# Patient Record
Sex: Female | Born: 1980 | Race: White | Hispanic: No | Marital: Single | Smoking: Former smoker
Health system: Southern US, Community
[De-identification: ages and names within clinical notes are randomized; demographics above are authoritative.]

## PROBLEM LIST (undated history)

## (undated) DIAGNOSIS — A749 Chlamydial infection, unspecified: Secondary | ICD-10-CM

## (undated) DIAGNOSIS — Z789 Other specified health status: Secondary | ICD-10-CM

## (undated) DIAGNOSIS — B009 Herpesviral infection, unspecified: Secondary | ICD-10-CM

## (undated) HISTORY — PX: WISDOM TOOTH EXTRACTION: SHX21

---

## 2008-01-08 HISTORY — PX: OTHER SURGICAL HISTORY: SHX169

## 2013-01-07 NOTE — L&D Delivery Note (Signed)
Delivery Note  SVD viable female Apgars 9,9 over small vaginal laceration.  Placenta delivered spontaneously intact with 3VC. Repair with 2-0 Chromic with good support and hemostasis noted and R/V exam confirms.  PH art was sent.  [place] cord blood was not done.  Mother and baby were doing well.  EBL 300cc  Candice Campavid Wayburn Shaler, MD

## 2013-05-14 LAB — OB RESULTS CONSOLE GC/CHLAMYDIA
CHLAMYDIA, DNA PROBE: NEGATIVE
Gonorrhea: NEGATIVE

## 2013-05-14 LAB — OB RESULTS CONSOLE RUBELLA ANTIBODY, IGM: RUBELLA: UNDETERMINED

## 2013-05-14 LAB — OB RESULTS CONSOLE ABO/RH: RH Type: POSITIVE

## 2013-05-14 LAB — OB RESULTS CONSOLE RPR: RPR: NONREACTIVE

## 2013-05-14 LAB — OB RESULTS CONSOLE HIV ANTIBODY (ROUTINE TESTING): HIV: NONREACTIVE

## 2013-05-14 LAB — OB RESULTS CONSOLE ANTIBODY SCREEN: ANTIBODY SCREEN: NEGATIVE

## 2013-05-14 LAB — OB RESULTS CONSOLE HEPATITIS B SURFACE ANTIGEN: Hepatitis B Surface Ag: NEGATIVE

## 2013-10-06 LAB — OB RESULTS CONSOLE GBS: GBS: POSITIVE

## 2013-12-15 ENCOUNTER — Encounter (HOSPITAL_COMMUNITY): Payer: Self-pay | Admitting: *Deleted

## 2013-12-15 ENCOUNTER — Inpatient Hospital Stay (HOSPITAL_COMMUNITY)
Admission: AD | Admit: 2013-12-15 | Discharge: 2013-12-15 | Disposition: A | Discharge status: Home / Self Care | Payer: BC Managed Care – PPO | Source: Ambulatory Visit | Attending: Obstetrics and Gynecology | Admitting: Obstetrics and Gynecology

## 2013-12-15 DIAGNOSIS — O471 False labor at or after 37 completed weeks of gestation: Secondary | ICD-10-CM | POA: Insufficient documentation

## 2013-12-15 DIAGNOSIS — O98313 Other infections with a predominantly sexual mode of transmission complicating pregnancy, third trimester: Secondary | ICD-10-CM | POA: Diagnosis not present

## 2013-12-15 DIAGNOSIS — Z3A39 39 weeks gestation of pregnancy: Secondary | ICD-10-CM | POA: Diagnosis not present

## 2013-12-15 DIAGNOSIS — A6000 Herpesviral infection of urogenital system, unspecified: Secondary | ICD-10-CM | POA: Diagnosis not present

## 2013-12-15 HISTORY — DX: Herpesviral infection, unspecified: B00.9

## 2013-12-15 HISTORY — DX: Chlamydial infection, unspecified: A74.9

## 2013-12-15 MED ORDER — OXYCODONE-ACETAMINOPHEN 5-325 MG PO TABS
1.0000 | ORAL_TABLET | Freq: Once | ORAL | Status: AC
  Administered 2013-12-15: 1 via ORAL
  Filled 2013-12-15: qty 1

## 2013-12-15 NOTE — Progress Notes (Signed)
No HSV lesions noted by L. Barefoot, NP

## 2013-12-15 NOTE — Progress Notes (Signed)
No lesions noted on perineum or labia. Patient states she was unable to look in the area, but felt tingling and slight bunring like when an out break occurs.

## 2013-12-15 NOTE — MAU Note (Signed)
uc's since 2030 last night, has active herpes outbreak right now.  Denies bleeding or LOF.

## 2013-12-15 NOTE — Discharge Instructions (Signed)
Keep your scheduled appointment for prenatal care. Call the office or provider/nurse on call with further concerns.

## 2013-12-16 ENCOUNTER — Inpatient Hospital Stay (HOSPITAL_COMMUNITY): Payer: BC Managed Care – PPO | Admitting: Anesthesiology

## 2013-12-16 ENCOUNTER — Inpatient Hospital Stay (HOSPITAL_COMMUNITY)
Admission: AD | Admit: 2013-12-16 | Discharge: 2013-12-18 | DRG: 774 | Disposition: A | Discharge status: Home / Self Care | Payer: BC Managed Care – PPO | Source: Ambulatory Visit | Attending: Obstetrics and Gynecology | Admitting: Obstetrics and Gynecology

## 2013-12-16 ENCOUNTER — Encounter (HOSPITAL_COMMUNITY): Payer: Self-pay | Admitting: *Deleted

## 2013-12-16 DIAGNOSIS — O9832 Other infections with a predominantly sexual mode of transmission complicating childbirth: Secondary | ICD-10-CM | POA: Diagnosis present

## 2013-12-16 DIAGNOSIS — O99824 Streptococcus B carrier state complicating childbirth: Secondary | ICD-10-CM | POA: Diagnosis present

## 2013-12-16 DIAGNOSIS — A6000 Herpesviral infection of urogenital system, unspecified: Secondary | ICD-10-CM | POA: Diagnosis present

## 2013-12-16 DIAGNOSIS — Z3A38 38 weeks gestation of pregnancy: Secondary | ICD-10-CM | POA: Diagnosis present

## 2013-12-16 DIAGNOSIS — Z87891 Personal history of nicotine dependence: Secondary | ICD-10-CM

## 2013-12-16 DIAGNOSIS — O09513 Supervision of elderly primigravida, third trimester: Secondary | ICD-10-CM | POA: Diagnosis not present

## 2013-12-16 HISTORY — DX: Other specified health status: Z78.9

## 2013-12-16 LAB — CBC
HEMATOCRIT: 37 % (ref 36.0–46.0)
HEMOGLOBIN: 12.6 g/dL (ref 12.0–15.0)
MCH: 32.6 pg (ref 26.0–34.0)
MCHC: 34.1 g/dL (ref 30.0–36.0)
MCV: 95.6 fL (ref 78.0–100.0)
Platelets: 307 10*3/uL (ref 150–400)
RBC: 3.87 MIL/uL (ref 3.87–5.11)
RDW: 12.8 % (ref 11.5–15.5)
WBC: 14.3 10*3/uL — ABNORMAL HIGH (ref 4.0–10.5)

## 2013-12-16 LAB — RPR

## 2013-12-16 LAB — POCT FERN TEST: POCT Fern Test: POSITIVE

## 2013-12-16 LAB — TYPE AND SCREEN
ABO/RH(D): A POS
Antibody Screen: NEGATIVE

## 2013-12-16 LAB — ABO/RH: ABO/RH(D): A POS

## 2013-12-16 LAB — HIV ANTIBODY (ROUTINE TESTING W REFLEX): HIV 1&2 Ab, 4th Generation: NONREACTIVE

## 2013-12-16 MED ORDER — ONDANSETRON HCL 4 MG PO TABS: 4.0000 mg | ORAL_TABLET | ORAL | Status: DC | PRN

## 2013-12-16 MED ORDER — PRENATAL MULTIVITAMIN CH
1.0000 | ORAL_TABLET | Freq: Every day | ORAL | Status: DC
  Administered 2013-12-17: 1 via ORAL
  Filled 2013-12-16: qty 1

## 2013-12-16 MED ORDER — CITRIC ACID-SODIUM CITRATE 334-500 MG/5ML PO SOLN: 30.0000 mL | ORAL | Status: DC | PRN

## 2013-12-16 MED ORDER — OXYCODONE-ACETAMINOPHEN 5-325 MG PO TABS: 2.0000 | ORAL_TABLET | ORAL | Status: DC | PRN

## 2013-12-16 MED ORDER — PENICILLIN G POTASSIUM 5000000 UNITS IJ SOLR
5.0000 10*6.[IU] | Freq: Once | INTRAVENOUS | Status: AC
  Administered 2013-12-16: 5 10*6.[IU] via INTRAVENOUS
  Filled 2013-12-16: qty 5

## 2013-12-16 MED ORDER — LACTATED RINGERS IV SOLN: 500.0000 mL | Freq: Once | INTRAVENOUS | Status: DC

## 2013-12-16 MED ORDER — TERBUTALINE SULFATE 1 MG/ML IJ SOLN: 0.2500 mg | Freq: Once | INTRAMUSCULAR | Status: DC | PRN

## 2013-12-16 MED ORDER — FENTANYL 2.5 MCG/ML BUPIVACAINE 1/10 % EPIDURAL INFUSION (WH - ANES)
14.0000 mL/h | INTRAMUSCULAR | Status: DC | PRN
  Administered 2013-12-16 (×2): 14 mL/h via EPIDURAL
  Filled 2013-12-16 (×2): qty 125

## 2013-12-16 MED ORDER — DIPHENHYDRAMINE HCL 25 MG PO CAPS: 25.0000 mg | ORAL_CAPSULE | Freq: Four times a day (QID) | ORAL | Status: DC | PRN

## 2013-12-16 MED ORDER — FLEET ENEMA 7-19 GM/118ML RE ENEM: 1.0000 | ENEMA | RECTAL | Status: DC | PRN

## 2013-12-16 MED ORDER — OXYCODONE-ACETAMINOPHEN 5-325 MG PO TABS: 1.0000 | ORAL_TABLET | ORAL | Status: DC | PRN

## 2013-12-16 MED ORDER — MEASLES, MUMPS & RUBELLA VAC ~~LOC~~ INJ: 0.5000 mL | INJECTION | Freq: Once | SUBCUTANEOUS | Status: DC

## 2013-12-16 MED ORDER — LANOLIN HYDROUS EX OINT: TOPICAL_OINTMENT | CUTANEOUS | Status: DC | PRN

## 2013-12-16 MED ORDER — MEDROXYPROGESTERONE ACETATE 150 MG/ML IM SUSP: 150.0000 mg | INTRAMUSCULAR | Status: DC | PRN

## 2013-12-16 MED ORDER — WITCH HAZEL-GLYCERIN EX PADS: 1.0000 "application " | MEDICATED_PAD | CUTANEOUS | Status: DC | PRN

## 2013-12-16 MED ORDER — ACETAMINOPHEN 325 MG PO TABS
650.0000 mg | ORAL_TABLET | ORAL | Status: DC | PRN
Start: 2013-12-16 — End: 2013-12-16

## 2013-12-16 MED ORDER — TETANUS-DIPHTH-ACELL PERTUSSIS 5-2.5-18.5 LF-MCG/0.5 IM SUSP: 0.5000 mL | Freq: Once | INTRAMUSCULAR | Status: DC

## 2013-12-16 MED ORDER — ZOLPIDEM TARTRATE 5 MG PO TABS: 5.0000 mg | ORAL_TABLET | Freq: Every evening | ORAL | Status: DC | PRN

## 2013-12-16 MED ORDER — PENICILLIN G POTASSIUM 5000000 UNITS IJ SOLR
2.5000 10*6.[IU] | INTRAVENOUS | Status: DC
  Administered 2013-12-16 (×3): 2.5 10*6.[IU] via INTRAVENOUS
  Filled 2013-12-16 (×6): qty 2.5

## 2013-12-16 MED ORDER — EPHEDRINE 5 MG/ML INJ
10.0000 mg | INTRAVENOUS | Status: DC | PRN
  Filled 2013-12-16: qty 2

## 2013-12-16 MED ORDER — LIDOCAINE HCL (PF) 1 % IJ SOLN
30.0000 mL | INTRAMUSCULAR | Status: DC | PRN
  Filled 2013-12-16: qty 30

## 2013-12-16 MED ORDER — DIBUCAINE 1 % RE OINT: 1.0000 "application " | TOPICAL_OINTMENT | RECTAL | Status: DC | PRN

## 2013-12-16 MED ORDER — ONDANSETRON HCL 4 MG/2ML IJ SOLN: 4.0000 mg | INTRAMUSCULAR | Status: DC | PRN

## 2013-12-16 MED ORDER — DIPHENHYDRAMINE HCL 50 MG/ML IJ SOLN: 12.5000 mg | INTRAMUSCULAR | Status: DC | PRN

## 2013-12-16 MED ORDER — LACTATED RINGERS IV SOLN
500.0000 mL | INTRAVENOUS | Status: DC | PRN
  Administered 2013-12-16: 500 mL via INTRAVENOUS

## 2013-12-16 MED ORDER — OXYTOCIN 40 UNITS IN LACTATED RINGERS INFUSION - SIMPLE MED
1.0000 m[IU]/min | INTRAVENOUS | Status: DC
  Administered 2013-12-16: 2 m[IU]/min via INTRAVENOUS

## 2013-12-16 MED ORDER — SENNOSIDES-DOCUSATE SODIUM 8.6-50 MG PO TABS
2.0000 | ORAL_TABLET | ORAL | Status: DC
  Administered 2013-12-16 – 2013-12-18 (×2): 2 via ORAL
  Filled 2013-12-16 (×2): qty 2

## 2013-12-16 MED ORDER — ONDANSETRON HCL 4 MG/2ML IJ SOLN
4.0000 mg | Freq: Four times a day (QID) | INTRAMUSCULAR | Status: DC | PRN
  Administered 2013-12-16: 4 mg via INTRAVENOUS
  Filled 2013-12-16: qty 2

## 2013-12-16 MED ORDER — BENZOCAINE-MENTHOL 20-0.5 % EX AERO
1.0000 "application " | INHALATION_SPRAY | CUTANEOUS | Status: DC | PRN
  Filled 2013-12-16: qty 56

## 2013-12-16 MED ORDER — OXYTOCIN BOLUS FROM INFUSION
500.0000 mL | INTRAVENOUS | Status: DC
  Administered 2013-12-16: 500 mL via INTRAVENOUS

## 2013-12-16 MED ORDER — LIDOCAINE HCL (PF) 1 % IJ SOLN
INTRAMUSCULAR | Status: DC | PRN
  Administered 2013-12-16 (×2): 9 mL

## 2013-12-16 MED ORDER — PHENYLEPHRINE 40 MCG/ML (10ML) SYRINGE FOR IV PUSH (FOR BLOOD PRESSURE SUPPORT)
80.0000 ug | PREFILLED_SYRINGE | INTRAVENOUS | Status: DC | PRN
  Filled 2013-12-16: qty 2

## 2013-12-16 MED ORDER — OXYTOCIN 40 UNITS IN LACTATED RINGERS INFUSION - SIMPLE MED
62.5000 mL/h | INTRAVENOUS | Status: DC
  Filled 2013-12-16: qty 1000

## 2013-12-16 MED ORDER — PHENYLEPHRINE 40 MCG/ML (10ML) SYRINGE FOR IV PUSH (FOR BLOOD PRESSURE SUPPORT)
80.0000 ug | PREFILLED_SYRINGE | INTRAVENOUS | Status: DC | PRN
  Filled 2013-12-16: qty 10
  Filled 2013-12-16: qty 2

## 2013-12-16 MED ORDER — FENTANYL 2.5 MCG/ML BUPIVACAINE 1/10 % EPIDURAL INFUSION (WH - ANES)
INTRAMUSCULAR | Status: DC | PRN
  Administered 2013-12-16: 14 mL/h via EPIDURAL

## 2013-12-16 MED ORDER — IBUPROFEN 600 MG PO TABS
600.0000 mg | ORAL_TABLET | Freq: Four times a day (QID) | ORAL | Status: DC
  Administered 2013-12-16 – 2013-12-18 (×7): 600 mg via ORAL
  Filled 2013-12-16 (×8): qty 1

## 2013-12-16 MED ORDER — LACTATED RINGERS IV SOLN
INTRAVENOUS | Status: DC
Start: 2013-12-16 — End: 2013-12-16
  Administered 2013-12-16 (×2): via INTRAVENOUS

## 2013-12-16 MED ORDER — SIMETHICONE 80 MG PO CHEW
80.0000 mg | CHEWABLE_TABLET | ORAL | Status: DC | PRN
  Administered 2013-12-16: 80 mg via ORAL
  Filled 2013-12-16: qty 1

## 2013-12-16 NOTE — Plan of Care (Signed)
Problem: Consults Goal: Postpartum Patient Education (See Patient Education module for education specifics.)  Outcome: Completed/Met Date Met:  12/16/13 Goal: Skin Care Protocol Initiated - if Braden Score 18 or less If consults are not indicated, leave blank or document N/A  Outcome: Not Applicable Date Met:  74/71/59 Goal: Nutrition Consult-if indicated Outcome: Not Applicable Date Met:  53/96/72 Goal: Diabetes Guidelines if Diabetic/Glucose > 140 If diabetic or lab glucose is > 140 mg/dl - Initiate Diabetes/Hyperglycemia Guidelines & Document Interventions  Outcome: Not Applicable Date Met:  89/79/15  Problem: Phase I Progression Outcomes Goal: Pain controlled with appropriate interventions Outcome: Completed/Met Date Met:  12/16/13 Goal: Voiding adequately Outcome: Completed/Met Date Met:  12/16/13 Goal: Foley catheter patent Outcome: Not Applicable Date Met:  04/20/62 Goal: OOB as tolerated unless otherwise ordered Outcome: Completed/Met Date Met:  12/16/13 Goal: VS, stable, temp < 100.4 degrees F Outcome: Completed/Met Date Met:  12/16/13 Goal: Initial discharge plan identified Outcome: Completed/Met Date Met:  12/16/13

## 2013-12-16 NOTE — MAU Note (Signed)
Pt in room, birthing suite nurse in room. Patient to amb to BR.

## 2013-12-16 NOTE — Anesthesia Preprocedure Evaluation (Signed)
Anesthesia Evaluation  Patient identified by MRN, date of birth, ID band Patient awake    Reviewed: Allergy & Precautions, H&P , NPO status , Patient's Chart, lab work & pertinent test results  Airway Mallampati: I TM Distance: >3 FB Neck ROM: full    Dental no notable dental hx.    Pulmonary former smoker,    Pulmonary exam normal       Cardiovascular negative cardio ROS      Neuro/Psych negative neurological ROS  negative psych ROS   GI/Hepatic negative GI ROS, Neg liver ROS,   Endo/Other  negative endocrine ROS  Renal/GU negative Renal ROS     Musculoskeletal   Abdominal Normal abdominal exam  (+)   Peds  Hematology negative hematology ROS (+)   Anesthesia Other Findings   Reproductive/Obstetrics (+) Pregnancy                           Anesthesia Physical Anesthesia Plan  ASA: II  Anesthesia Plan: Epidural   Post-op Pain Management:    Induction:   Airway Management Planned:   Additional Equipment:   Intra-op Plan:   Post-operative Plan:   Informed Consent: I have reviewed the patients History and Physical, chart, labs and discussed the procedure including the risks, benefits and alternatives for the proposed anesthesia with the patient or authorized representative who has indicated his/her understanding and acceptance.     Plan Discussed with:   Anesthesia Plan Comments:         Anesthesia Quick Evaluation  

## 2013-12-16 NOTE — H&P (Signed)
Kimberly LecherRebecca Galloway is a 33 y.o. female presenting for labor and SROM at 0100 clear fluid.  Preg complicated by AMA with normal NIPT and GBS positve.  Also HSV without recent sxs or outbreaks  History OB History    Gravida Para Term Preterm AB TAB SAB Ectopic Multiple Living   1         0     Past Medical History  Diagnosis Date  . HSV (herpes simplex virus) infection   . Chlamydia   . Medical history non-contributory    Past Surgical History  Procedure Laterality Date  . Uterine septum removed  2010  . Wisdom tooth extraction     Family History: family history is not on file. Social History:  reports that she has quit smoking. She has never used smokeless tobacco. She reports that she drinks alcohol. She reports that she does not use illicit drugs.   Prenatal Transfer Tool  Maternal Diabetes: No Genetic Screening: Normal Maternal Ultrasounds/Referrals: Normal Fetal Ultrasounds or other Referrals:  None Maternal Substance Abuse:  No Significant Maternal Medications:  None Significant Maternal Lab Results:  None Other Comments:  None  ROS  Dilation: 6.5 Effacement (%): 100 Station: -1 Exam by:: Veronica Mensah Blood pressure 128/67, pulse 86, temperature 98.6 F (37 C), temperature source Oral, resp. rate 18, height 5\' 5"  (1.651 m), weight 203 lb (92.08 kg), SpO2 98 %. Exam Physical Exam   My exam 5-6/c/-1 no HSV lesions seen Prenatal labs: ABO, Rh: --/--/A POS, A POS (12/10 0200) Antibody: NEG (12/10 0200) Rubella: Equivocal (05/08 0000) RPR: Nonreactive (05/08 0000)  HBsAg: Negative (05/08 0000)  HIV: Non-reactive (05/08 0000)  GBS: Positive (09/30 0000)   Assessment/Plan: IUP at term Active labor, now with inadequate pattern.  Will augment with pitocin IV Abx for GBS   Alex Leahy C 12/16/2013, 8:20 AM

## 2013-12-16 NOTE — MAU Note (Signed)
Report given to Park Forest Villagehristy, Charity fundraiserN in birthing suites. Strip reviewed. May transfer patient to room 164

## 2013-12-16 NOTE — Anesthesia Procedure Notes (Signed)
Epidural Patient location during procedure: OB Start time: 12/16/2013 2:43 AM End time: 12/16/2013 2:47 AM  Staffing Anesthesiologist: Leilani AbleHATCHETT, Camari Wisham Performed by: anesthesiologist   Preanesthetic Checklist Completed: patient identified, surgical consent, pre-op evaluation, timeout performed, IV checked, risks and benefits discussed and monitors and equipment checked  Epidural Patient position: sitting Prep: site prepped and draped and DuraPrep Patient monitoring: continuous pulse ox and blood pressure Approach: midline Location: L3-L4 Injection technique: LOR air  Needle:  Needle type: Tuohy  Needle gauge: 17 G Needle length: 9 cm and 9 Needle insertion depth: 5 cm cm Catheter type: closed end flexible Catheter size: 19 Gauge Catheter at skin depth: 10 cm Test dose: negative and Other  Assessment Sensory level: T9 Events: blood not aspirated, injection not painful, no injection resistance, negative IV test and no paresthesia  Additional Notes Reason for block:procedure for pain

## 2013-12-17 LAB — CBC
HCT: 31.2 % — ABNORMAL LOW (ref 36.0–46.0)
HEMOGLOBIN: 10.5 g/dL — AB (ref 12.0–15.0)
MCH: 32.9 pg (ref 26.0–34.0)
MCHC: 33.7 g/dL (ref 30.0–36.0)
MCV: 97.8 fL (ref 78.0–100.0)
Platelets: 223 10*3/uL (ref 150–400)
RBC: 3.19 MIL/uL — ABNORMAL LOW (ref 3.87–5.11)
RDW: 13 % (ref 11.5–15.5)
WBC: 15.4 10*3/uL — AB (ref 4.0–10.5)

## 2013-12-17 NOTE — Anesthesia Postprocedure Evaluation (Signed)
Anesthesia Post Note  Patient: Kimberly LecherRebecca Kitt  Procedure(s) Performed: * No procedures listed *  Anesthesia type: Epidural  Patient location: Mother/Baby  Post pain: Pain level controlled  Post assessment: Post-op Vital signs reviewed  Last Vitals:  Filed Vitals:   12/17/13 0457  BP: 120/68  Pulse: 66  Temp: 36.7 C  Resp:     Post vital signs: Reviewed  Level of consciousness:alert  Complications: No apparent anesthesia complications

## 2013-12-17 NOTE — Progress Notes (Signed)
Post Partum Day 1 Subjective: no complaints, up ad lib, voiding and tolerating PO  Objective: Blood pressure 120/68, pulse 66, temperature 98 F (36.7 C), temperature source Oral, resp. rate 18, height 5\' 5"  (1.651 m), weight 203 lb (92.08 kg), SpO2 100 %, unknown if currently breastfeeding.  Physical Exam:  General: alert and cooperative Lochia: appropriate Uterine Fundus: firm Incision: healing well DVT Evaluation: No evidence of DVT seen on physical exam. Negative Homan's sign. No cords or calf tenderness. No significant calf/ankle edema.   Recent Labs  12/16/13 0200 12/17/13 0619  HGB 12.6 10.5*  HCT 37.0 31.2*    Assessment/Plan: Plan for discharge tomorrow   LOS: 1 day   Kimberly Galloway G 12/17/2013, 8:06 AM

## 2013-12-18 MED ORDER — IBUPROFEN 600 MG PO TABS: 600.0000 mg | ORAL_TABLET | Freq: Four times a day (QID) | ORAL | Status: DC

## 2013-12-18 NOTE — Lactation Note (Signed)
This note was copied from the chart of Kimberly Beatrice LecherRebecca Barhorst. Lactation Consultation Note; Follow up visit with mom before DC. She reports baby fed a lot through the night. Reassurance given. Baby asleep in dad's arms.Reports nipples are a little tender- comfort gels given with instructions. No questions at present. To call prn.  Patient Name: Kimberly Galloway ZOXWR'UToday's Date: 12/18/2013 Reason for consult: Follow-up assessment   Maternal Data Formula Feeding for Exclusion: No Has patient been taught Hand Expression?: Yes Does the patient have breastfeeding experience prior to this delivery?: No  Feeding Feeding Type: Breast Fed Length of feed: 15 min  LATCH Score/Interventions Latch: Grasps breast easily, tongue down, lips flanged, rhythmical sucking.  Audible Swallowing: A few with stimulation Intervention(s): Skin to skin  Type of Nipple: Everted at rest and after stimulation  Comfort (Breast/Nipple): Filling, red/small blisters or bruises, mild/mod discomfort  Problem noted: Mild/Moderate discomfort Interventions (Mild/moderate discomfort): Hand expression  Hold (Positioning): No assistance needed to correctly position infant at breast. Intervention(s): Breastfeeding basics reviewed;Skin to skin  LATCH Score: 8  Lactation Tools Discussed/Used     Consult Status Consult Status: Complete    Pamelia HoitWeeks, Ziyan Schoon D 12/18/2013, 9:30 AM

## 2013-12-18 NOTE — Plan of Care (Signed)
Problem: Discharge Progression Outcomes Goal: MMR given as ordered Outcome: Not Applicable Date Met:  31/12/16 Pt declined MMR vaccine, VIS information provided.

## 2013-12-18 NOTE — Discharge Summary (Signed)
Obstetric Discharge Summary Reason for Admission: onset of labor Prenatal Procedures: none Intrapartum Procedures: spontaneous vaginal delivery Postpartum Procedures: none Complications-Operative and Postpartum: 2nd degree perineal laceration HEMOGLOBIN  Date Value Ref Range Status  12/17/2013 10.5* 12.0 - 15.0 g/dL Final   HCT  Date Value Ref Range Status  12/17/2013 31.2* 36.0 - 46.0 % Final    Physical Exam:  General: alert, cooperative and appears stated age 3Lochia: appropriate Uterine Fundus: firm Incision: healing well, no significant drainage, no dehiscence, no significant erythema DVT Evaluation: No evidence of DVT seen on physical exam.  Discharge Diagnoses: Term Pregnancy-delivered  Discharge Information: Date: 12/18/2013 Activity: pelvic rest Diet: routine Medications: Ibuprofen Condition: stable Instructions: refer to practice specific booklet Discharge to: home   Newborn Data: Live born female  Birth Weight: 6 lb 4.5 oz (2850 g) APGAR: 9, 9  Home with mother.  Kimberly Galloway L 12/18/2013, 8:55 AM

## 2013-12-21 ENCOUNTER — Ambulatory Visit (HOSPITAL_COMMUNITY)
Admission: RE | Admit: 2013-12-21 | Discharge: 2013-12-21 | Disposition: A | Discharge status: Home / Self Care | Payer: BC Managed Care – PPO | Source: Ambulatory Visit | Attending: Obstetrics & Gynecology | Admitting: Obstetrics & Gynecology

## 2013-12-21 NOTE — Lactation Note (Signed)
Lactation Consult  Mother's reason for visit:  Sore nipples, trouble latching Visit Type:  Feedining assessment Appointment Notes:  None Consult:  Initial Lactation Consultant:  Huston FoleyMOULDEN, Dwane Andres S  ________________________________________________________________________   Baby's Name: Kimberly Galloway Date of Birth: 12/16/2015 Pediatrician: Maisie Fushomas Gender: female Gestational Age: 3438w3d (At Birth) Birth Weight: 6 lb 4.5 oz (2850 g) Weight at Discharge: Weight: 5 lb 13.7 oz (2655 g)Date of Discharge: 12/18/2013 Filed Weights   12/16/13 1448 12/16/13 2301 12/18/13 0000  Weight: 6 lb 4.5 oz (2850 g) 6 lb 3.5 oz (2820 g) 5 lb 13.7 oz (2655 g)   [facility]: 5-14 on 12/20/13 Location:Pediatrician's office Weight today: 5-15     ________________________________________________________________________  Mother's Name: Kimberly Galloway Type of delivery:  Vaginal Breastfeeding Experience:  First baby Maternal Medical Conditions:  none Maternal Medications:  Ibuprofen, PNV'S  ________________________________________________________________________  Breastfeeding History (Post Discharge)  Frequency of breastfeeding:  Every 3 hours Duration of feeding:  30 minutes  Patient does not supplement or pump.  Infant Intake and Output Assessment  Voids:  3 in 24 hrs.  Color:  Dark yellow Stools:  3 in 24 hrs.  Color:  Yellow  ________________________________________________________________________  Maternal Breast Assessment  Breast:  Engorged Nipple:  Cracked  Pain interventions:  Comfort gels  _______________________________________________________________________ Feeding Assessment/Evaluation  Mom came into rent a DEBP due to sore nipples, engorgement and difficult latch.  Outpatient assessment offered and mom accepted.  Baby is 715 days old and was latching well in hospital.   Mom states now that breasts are  full baby has a difficult/shallow latch.  Breasts are both engorged/firm.  Nipple ends are cracked.  Baby placed skin to skin in football hold but showing no interest in feeding.  Waking techniques done but baby remains sleepy.  Mom states baby had a good feeding about 2 hours ago.  Reviewed techniques for correct positioning and latch techniques.  Instructed on engorgement treatment, pre pumping, and pumping as needed after feeding for comfort.  Comfort gels given with instructions.  Mom rented a pump today.  Instructed to call if no improvement or other concerns.  Initial feeding assessment:  Infant's oral assessment:  WNL

## 2014-01-11 ENCOUNTER — Ambulatory Visit (HOSPITAL_COMMUNITY)
Admission: RE | Admit: 2014-01-11 | Discharge: 2014-01-11 | Disposition: A | Discharge status: Home / Self Care | Payer: BLUE CROSS/BLUE SHIELD | Source: Ambulatory Visit | Attending: Obstetrics and Gynecology | Admitting: Obstetrics and Gynecology

## 2014-01-11 DIAGNOSIS — Z319 Encounter for procreative management, unspecified: Secondary | ICD-10-CM | POA: Insufficient documentation

## 2014-01-11 NOTE — Lactation Note (Signed)
Lactation Consult  Mother's reason for visit:  Pain during and after feeding Visit Type:  Feeding assessment Appointment Notes:  none Consult:  Follow-Up Lactation Consultant:  Huston FoleyMOULDEN, Kimberly Kilmer S  ________________________________________________________________________   Kimberly FloresBaby's Name: Kimberly BargesKyleigh Galloway Date of Birth: 12/16/2015 Pediatrician: Maisie Fushomas Gender: female Gestational Age: 4757w3d (At Birth) Birth Weight: 6 lb 4.5 oz (2850 g) Weight at Discharge: Weight: 5 lb 13.7 oz (2655 g)Date of Discharge: 12/18/2013 Filed Weights   12/16/13 1448 12/16/13 2301 12/18/13 0000  Weight: 6 lb 4.5 oz (2850 g) 6 lb 3.5 oz (2820 g) 5 lb 13.7 oz (2655 g)   Last weight taken from [facility]: 6-4 on 12/29/13 Location:Pediatrician's office Weight today: 7-10.3     ________________________________________________________________________  Mother's Name: Kimberly Galloway Type of delivery:  vaginal Breastfeeding Experience: First baby    ________________________________________________________________________  Breastfeeding History (Post Discharge)  Frequency of breastfeeding:  Every 2-4 hours Duration of feeding:  20-45 minutes  Supplementation  Formula:  Volume 30-90 ml Frequency:  Every other feeding       Brand: Enfamil  Breastmilk:  Volume 90ml Frequency:  Every other feeding Method:  Bottle Dr. Irving BurtonBrowns,   Pumping  Type of pump:  Medela pump in style Frequency:  Every 3-4 hours Volume:  60-4090ml  Infant Intake and Output Assessment  Voids:  6+ in 24 hrs.  Color:  Clear yellow Stools:  4-8 in 24 hrs.  Color:  Yellow  ________________________________________________________________________  Maternal Breast Assessment  Breast:  Soft Nipple:  Erect and Reddened Pain level:  3 Pain interventions:  Expressed breast milk  _______________________________________________________________________ Feeding Assessment/Evaluation  Mom and 703  week old baby here for feeding assessment due to pain during and after feeding.  Mom states she exclusively breastfed infant for first 2 weeks.  Due to nipple cracking and bleeding she has rested nipples for past week pumping and bottle feeding.  Nipples intact but slightly reddened.  Mom started putting baby back to breast last night.  Baby initially was unable to sustain a latch so 24 mm nipple shield used.  Baby nursed off and on but continued to pop off. Mom states less pain with nipple shield.  After suck training baby latched fairly deep to opposite breast without shield but did some chewing and tongue thrusting.  Mom more uncomfortable with this feeding and nipple pinched when baby came off.  Mom will have frenulums evaluated by MD.  Instructed to work on increasing milk supply by pumping 8-12 times per day.  Mom may pump and bottle feed until tongue is evaluated.  She is taking mother's milk plus.  Encouraged to call us prn  Initial feeding assessment:  Infant's oral assessment:  Variance  Baby has tight labial frenulum and possible tight posterior lingual frenulum  Positioning:  Cross cradle Right breast, left breast  LATCH documentation:  Latch:  1 = Repeated attempts needed to sustain latch, nipple held in mouth throughout feeding, stimulation needed to elicit sucking reflex.  Audible swallowing:  1 = A few with stimulation  Type of nipple:  2 = Everted at rest and after stimulation  Comfort (Breast/Nipple):  1 = Filling, red/small blisters or bruises, mild/mod discomfort  Hold (Positioning):  1 = Assistance needed to correctly position infant at breast and maintain latch  LATCH score:  6  Attached assessment:  Shallow  Lips flanged:  Yes.    Lips untucked:  No.  Suck assessment:  Displays both  Tools:  Nipple shield 24 mm Instructed  on use and cleaning of tool:  Yes.    Pre-feed weight: 3446  g  Post-feed weight:  3486 g Amount transferred:  40ml Amount supplemented:   60ml      Total amount transferred:  40ml Total supplement given:  60 ml

## 2014-12-21 ENCOUNTER — Encounter: Payer: Self-pay | Admitting: Internal Medicine

## 2014-12-21 ENCOUNTER — Other Ambulatory Visit (INDEPENDENT_AMBULATORY_CARE_PROVIDER_SITE_OTHER): Payer: BLUE CROSS/BLUE SHIELD

## 2014-12-21 ENCOUNTER — Ambulatory Visit (INDEPENDENT_AMBULATORY_CARE_PROVIDER_SITE_OTHER): Payer: BLUE CROSS/BLUE SHIELD | Admitting: Internal Medicine

## 2014-12-21 VITALS — BP 112/78 | HR 74 | Temp 98.4°F | Resp 16 | Wt 166.0 lb

## 2014-12-21 DIAGNOSIS — Z0000 Encounter for general adult medical examination without abnormal findings: Secondary | ICD-10-CM

## 2014-12-21 LAB — COMPREHENSIVE METABOLIC PANEL
ALK PHOS: 47 U/L (ref 39–117)
ALT: 14 U/L (ref 0–35)
AST: 15 U/L (ref 0–37)
Albumin: 4.3 g/dL (ref 3.5–5.2)
BUN: 12 mg/dL (ref 6–23)
CO2: 30 meq/L (ref 19–32)
Calcium: 9.3 mg/dL (ref 8.4–10.5)
Chloride: 104 mEq/L (ref 96–112)
Creatinine, Ser: 0.91 mg/dL (ref 0.40–1.20)
GFR: 74 mL/min (ref >60.00)
Glucose, Bld: 69 mg/dL — ABNORMAL LOW (ref 70–99)
POTASSIUM: 4 meq/L (ref 3.5–5.1)
Sodium: 142 mEq/L (ref 135–145)
Total Bilirubin: 0.5 mg/dL (ref 0.2–1.2)
Total Protein: 7.1 g/dL (ref 6.0–8.3)

## 2014-12-21 LAB — TSH: TSH: 3.67 u[IU]/mL (ref 0.35–4.50)

## 2014-12-21 LAB — LIPID PANEL
Cholesterol: 223 mg/dL — ABNORMAL HIGH (ref 0–200)
HDL: 64.2 mg/dL (ref >39.00)
LDL Cholesterol: 138 mg/dL — ABNORMAL HIGH (ref 0–99)
NonHDL: 158.97
Total CHOL/HDL Ratio: 3
Triglycerides: 104 mg/dL (ref 0.0–149.0)
VLDL: 20.8 mg/dL (ref 0.0–40.0)

## 2014-12-21 LAB — T4, FREE: FREE T4: 0.84 ng/dL (ref 0.60–1.60)

## 2014-12-21 NOTE — Assessment & Plan Note (Signed)
Checking labs today, talked to her about the need for regular exercise to help with her mood. Some mild post partum depression (was not able to get in to see her OB so did not seek treatment) which is improving gradually. Just some mild concentration problems left which generally do not affect her at work.

## 2014-12-21 NOTE — Progress Notes (Signed)
   Subjective:    Patient ID: Kimberly LecherRebecca Cruickshank, female    DOB: 1981/01/05, 34 y.o.   MRN: [id number]  HPI The patient is a 34 YO female coming in new for wellness. No concerns but has a about 36 year old child and needs someone in case she gets sick. Has gotten some daycare bugs over the last year.   PMH, [facility], social history reviewed and updated.   Review of Systems  Constitutional: Negative for fever, activity change, appetite change, fatigue and unexpected weight change.  HENT: Negative.   Eyes: Negative.   Respiratory: Negative for cough, chest tightness, shortness of breath and wheezing.   Cardiovascular: Negative for chest pain, palpitations and leg swelling.  Gastrointestinal: Negative for nausea, abdominal pain, diarrhea, constipation and abdominal distention.  Musculoskeletal: Negative.   Skin: Negative.   Neurological: Negative.   Psychiatric/Behavioral: Negative.       Objective:   Physical Exam  Constitutional: She is oriented to person, place, and time. She appears well-developed and well-nourished.  HENT:  Head: Normocephalic and atraumatic.  Eyes: EOM are normal.  Neck: Normal range of motion.  Cardiovascular: Normal rate and regular rhythm.   Pulmonary/Chest: Effort normal and breath sounds normal. No respiratory distress. She has no wheezes. She has no rales.  Abdominal: Soft. Bowel sounds are normal. She exhibits no distension. There is no tenderness. There is no rebound.  Musculoskeletal: She exhibits no edema.  Neurological: She is alert and oriented to person, place, and time. Coordination normal.  Skin: Skin is warm and dry.  Psychiatric: She has a normal mood and affect.   Filed Vitals:   12/21/14 0856  BP: 112/78  Pulse: 74  Temp: 98.4 F (36.9 C)  TempSrc: Oral  Resp: 16  Weight: 166 lb (75.297 kg)  SpO2: 99%      Assessment & Plan:

## 2014-12-21 NOTE — Patient Instructions (Signed)
We will check the blood work today and call you back about the results.  Come back in 1-2 years for a check up and if you have any problems or questions please feel free to call us sooner.   Health Maintenance, Female Adopting a healthy lifestyle and getting preventive care can go a long way to promote health and wellness. Talk with your health care provider about what schedule of regular examinations is right for you. This is a good chance for you to check in with your provider about disease prevention and staying healthy. In between checkups, there are plenty of things you can do on your own. Experts have done a lot of research about which lifestyle changes and preventive measures are most likely to keep you healthy. Ask your health care provider for more information. WEIGHT AND DIET  Eat a healthy diet  Be sure to include plenty of vegetables, fruits, low-fat dairy products, and lean protein.  Do not eat a lot of foods high in solid fats, added sugars, or salt.  Get regular exercise. This is one of the most important things you can do for your health.  Most adults should exercise for at least 150 minutes each week. The exercise should increase your heart rate and make you sweat (moderate-intensity exercise).  Most adults should also do strengthening exercises at least twice a week. This is in addition to the moderate-intensity exercise.  Maintain a healthy weight  Body mass index (BMI) is a measurement that can be used to identify possible weight problems. It estimates body fat based on height and weight. Your health care provider can help determine your BMI and help you achieve or maintain a healthy weight.  For females 97 years of age and older:   A BMI below 18.5 is considered underweight.  A BMI of 18.5 to 24.9 is normal.  A BMI of 25 to 29.9 is considered overweight.  A BMI of 30 and above is considered obese.  Watch levels of cholesterol and blood lipids  You should  start having your blood tested for lipids and cholesterol at 36 years of age, then have this test every 5 years.  You may need to have your cholesterol levels checked more often if:  Your lipid or cholesterol levels are high.  You are older than 36 years of age.  You are at high risk for heart disease.  CANCER SCREENING   Lung Cancer  Lung cancer screening is recommended for adults 47-20 years old who are at high risk for lung cancer because of a history of smoking.  A yearly low-dose CT scan of the lungs is recommended for people who:  Currently smoke.  Have quit within the past 15 years.  Have at least a 30-pack-year history of smoking. A pack year is smoking an average of one pack of cigarettes a day for 1 year.  Yearly screening should continue until it has been 15 years since you quit.  Yearly screening should stop if you develop a health problem that would prevent you from having lung cancer treatment.  Breast Cancer  Practice breast self-awareness. This means understanding how your breasts normally appear and feel.  It also means doing regular breast self-exams. Let your health care provider know about any changes, no matter how small.  If you are in your 20s or 30s, you should have a clinical breast exam (CBE) by a health care provider every 1-3 years as part of a regular health exam.  If  you are 40 or older, have a CBE every year. Also consider having a breast X-ray (mammogram) every year.  If you have a family history of breast cancer, talk to your health care provider about genetic screening.  If you are at high risk for breast cancer, talk to your health care provider about having an MRI and a mammogram every year.  Breast cancer gene (BRCA) assessment is recommended for women who have family members with BRCA-related cancers. BRCA-related cancers include:  Breast.  Ovarian.  Tubal.  Peritoneal cancers.  Results of the assessment will determine the  need for genetic counseling and BRCA1 and BRCA2 testing. Cervical Cancer Your health care provider may recommend that you be screened regularly for cancer of the pelvic organs (ovaries, uterus, and vagina). This screening involves a pelvic examination, including checking for microscopic changes to the surface of your cervix (Pap test). You may be encouraged to have this screening done every 3 years, beginning at age 1.  For women ages 54-65, health care providers may recommend pelvic exams and Pap testing every 3 years, or they may recommend the Pap and pelvic exam, combined with testing for human papilloma virus (HPV), every 5 years. Some types of HPV increase your risk of cervical cancer. Testing for HPV may also be done on women of any age with unclear Pap test results.  Other health care providers may not recommend any screening for nonpregnant women who are considered low risk for pelvic cancer and who do not have symptoms. Ask your health care provider if a screening pelvic exam is right for you.  If you have had past treatment for cervical cancer or a condition that could lead to cancer, you need Pap tests and screening for cancer for at least 20 years after your treatment. If Pap tests have been discontinued, your risk factors (such as having a new sexual partner) need to be reassessed to determine if screening should resume. Some women have medical problems that increase the chance of getting cervical cancer. In these cases, your health care provider may recommend more frequent screening and Pap tests. Colorectal Cancer  This type of cancer can be detected and often prevented.  Routine colorectal cancer screening usually begins at 36 years of age and continues through 36 years of age.  Your health care provider may recommend screening at an earlier age if you have risk factors for colon cancer.  Your health care provider may also recommend using home test kits to check for hidden blood in  the stool.  A small camera at the end of a tube can be used to examine your colon directly (sigmoidoscopy or colonoscopy). This is done to check for the earliest forms of colorectal cancer.  Routine screening usually begins at age 56.  Direct examination of the colon should be repeated every 5-10 years through 36 years of age. However, you may need to be screened more often if early forms of precancerous polyps or small growths are found. Skin Cancer  Check your skin from head to toe regularly.  Tell your health care provider about any new moles or changes in moles, especially if there is a change in a mole's shape or color.  Also tell your health care provider if you have a mole that is larger than the size of a pencil eraser.  Always use sunscreen. Apply sunscreen liberally and repeatedly throughout the day.  Protect yourself by wearing long sleeves, pants, a wide-brimmed hat, and sunglasses whenever you are  outside. HEART DISEASE, DIABETES, AND HIGH BLOOD PRESSURE   High blood pressure causes heart disease and increases the risk of stroke. High blood pressure is more likely to develop in:  People who have blood pressure in the high end of the normal range (130-139/85-89 mm Hg).  People who are overweight or obese.  People who are African American.  If you are 18-39 years of age, have your blood pressure checked every 3-5 years. If you are 40 years of age or older, have your blood pressure checked every year. You should have your blood pressure measured twice--once when you are at a hospital or clinic, and once when you are not at a hospital or clinic. Record the average of the two measurements. To check your blood pressure when you are not at a hospital or clinic, you can use:  An automated blood pressure machine at a pharmacy.  A home blood pressure monitor.  If you are between 55 years and 79 years old, ask your health care provider if you should take aspirin to prevent  strokes.  Have regular diabetes screenings. This involves taking a blood sample to check your fasting blood sugar level.  If you are at a normal weight and have a low risk for diabetes, have this test once every three years after 36 years of age.  If you are overweight and have a high risk for diabetes, consider being tested at a younger age or more often. PREVENTING INFECTION  Hepatitis B  If you have a higher risk for hepatitis B, you should be screened for this virus. You are considered at high risk for hepatitis B if:  You were born in a country where hepatitis B is common. Ask your health care provider which countries are considered high risk.  Your parents were born in a high-risk country, and you have not been immunized against hepatitis B (hepatitis B vaccine).  You have HIV or AIDS.  You use needles to inject street drugs.  You live with someone who has hepatitis B.  You have had sex with someone who has hepatitis B.  You get hemodialysis treatment.  You take certain medicines for conditions, including cancer, organ transplantation, and autoimmune conditions. Hepatitis C  Blood testing is recommended for:  Everyone born from 1945 through 1965.  Anyone with known risk factors for hepatitis C. Sexually transmitted infections (STIs)  You should be screened for sexually transmitted infections (STIs) including gonorrhea and chlamydia if:  You are sexually active and are younger than 36 years of age.  You are older than 36 years of age and your health care provider tells you that you are at risk for this type of infection.  Your sexual activity has changed since you were last screened and you are at an increased risk for chlamydia or gonorrhea. Ask your health care provider if you are at risk.  If you do not have HIV, but are at risk, it may be recommended that you take a prescription medicine daily to prevent HIV infection. This is called pre-exposure prophylaxis  (PrEP). You are considered at risk if:  You are sexually active and do not regularly use condoms or know the HIV status of your partner(s).  You take drugs by injection.  You are sexually active with a partner who has HIV. Talk with your health care provider about whether you are at high risk of being infected with HIV. If you choose to begin PrEP, you should first be tested for HIV.   You should then be tested every 3 months for as long as you are taking PrEP.  PREGNANCY   If you are premenopausal and you may become pregnant, ask your health care provider about preconception counseling.  If you may become pregnant, take 400 to 800 micrograms (mcg) of folic acid every day.  If you want to prevent pregnancy, talk to your health care provider about birth control (contraception). OSTEOPOROSIS AND MENOPAUSE   Osteoporosis is a disease in which the bones lose minerals and strength with aging. This can result in serious bone fractures. Your risk for osteoporosis can be identified using a bone density scan.  If you are 36 years of age or older, or if you are at risk for osteoporosis and fractures, ask your health care provider if you should be screened.  Ask your health care provider whether you should take a calcium or vitamin D supplement to lower your risk for osteoporosis.  Menopause may have certain physical symptoms and risks.  Hormone replacement therapy may reduce some of these symptoms and risks. Talk to your health care provider about whether hormone replacement therapy is right for you.  HOME CARE INSTRUCTIONS   Schedule regular health, dental, and eye exams.  Stay current with your immunizations.   Do not use any tobacco products including cigarettes, chewing tobacco, or electronic cigarettes.  If you are pregnant, do not drink alcohol.  If you are breastfeeding, limit how much and how often you drink alcohol.  Limit alcohol intake to no more than 1 drink per day for  nonpregnant women. One drink equals 12 ounces of beer, 5 ounces of wine, or 1 ounces of hard liquor.  Do not use street drugs.  Do not share needles.  Ask your health care provider for help if you need support or information about quitting drugs.  Tell your health care provider if you often feel depressed.  Tell your health care provider if you have ever been abused or do not feel safe at home.   This information is not intended to replace advice given to you by your health care provider. Make sure you discuss any questions you have with your health care provider.   Document Released: 07/09/2010 Document Revised: 01/14/2014 Document Reviewed: 11/25/2012 Elsevier Interactive Patient Education [employer].

## 2014-12-21 NOTE — Progress Notes (Signed)
Pre visit review using our clinic review tool, if applicable. No additional management support is needed unless otherwise documented below in the visit note. 

## 2015-06-27 DIAGNOSIS — Z01419 Encounter for gynecological examination (general) (routine) without abnormal findings: Secondary | ICD-10-CM | POA: Diagnosis not present

## 2015-06-27 DIAGNOSIS — N762 Acute vulvitis: Secondary | ICD-10-CM | POA: Diagnosis not present

## 2015-06-27 DIAGNOSIS — Z01411 Encounter for gynecological examination (general) (routine) with abnormal findings: Secondary | ICD-10-CM | POA: Diagnosis not present

## 2015-06-27 DIAGNOSIS — Z6827 Body mass index (BMI) 27.0-27.9, adult: Secondary | ICD-10-CM | POA: Diagnosis not present

## 2015-09-01 ENCOUNTER — Encounter: Payer: Self-pay | Admitting: Internal Medicine

## 2015-09-01 ENCOUNTER — Ambulatory Visit (INDEPENDENT_AMBULATORY_CARE_PROVIDER_SITE_OTHER): Payer: BLUE CROSS/BLUE SHIELD | Admitting: Internal Medicine

## 2015-09-01 ENCOUNTER — Other Ambulatory Visit (INDEPENDENT_AMBULATORY_CARE_PROVIDER_SITE_OTHER): Payer: BLUE CROSS/BLUE SHIELD

## 2015-09-01 VITALS — BP 114/78 | HR 74 | Temp 98.5°F | Resp 16 | Ht 65.0 in | Wt 166.0 lb

## 2015-09-01 DIAGNOSIS — J392 Other diseases of pharynx: Secondary | ICD-10-CM

## 2015-09-01 LAB — TSH: TSH: 3.1 u[IU]/mL (ref 0.35–4.50)

## 2015-09-01 LAB — T4, FREE: FREE T4: 0.97 ng/dL (ref 0.60–1.60)

## 2015-09-01 NOTE — Progress Notes (Signed)
Pre visit review using our clinic review tool, if applicable. No additional management support is needed unless otherwise documented below in the visit note. 

## 2015-09-01 NOTE — Assessment & Plan Note (Signed)
Checking thyroid levels due to family history of thyroid cancer. Suspect post nasal drip and she will start zyrtec for several weeks. If no resolution will refer to ENT for visualization of the vocal cords.

## 2015-09-01 NOTE — Patient Instructions (Signed)
Start taking zyrtec (also called cetirizine) daily for a couple of weeks to see if this helps with the symptoms.   Sometimes allergies can cause some congestion and drainage in the throat and some people do have allergies year round.  If this is not helpful call us back and we will have you see the ear, nose, and throat doctor and they can look in the throat to make sure it is okay.

## 2015-09-01 NOTE — Progress Notes (Signed)
   Subjective:    Patient ID: Kimberly LecherRebecca Galloway, female    DOB: January 20, 1980, 35 y.o.   MRN: [id number]  HPI The patient is a 35 YO female coming in for throat irritation. She is having to clear her throat in the morning. If talking for >1 hour she is hoarse and gets pain with yelling at the kids or dog. Going on for a long time but she is not able to quantify. She has tried OTC products and lozenges which are not helpful. She does not do a lot of singing. No fevers or chills. Has a mild cold now and congestion and drainage but before the cold she did not have drainage symptoms and still had the throat symptoms.   Review of Systems  Constitutional: Negative for activity change, appetite change, fatigue, fever and unexpected weight change.  HENT: Positive for congestion, postnasal drip, sore throat and voice change.   Respiratory: Negative for cough, chest tightness, shortness of breath and wheezing.   Cardiovascular: Negative for chest pain, palpitations and leg swelling.  Gastrointestinal: Negative for abdominal distention, abdominal pain, constipation, diarrhea and nausea.  Musculoskeletal: Negative.   Skin: Negative.   Neurological: Negative.   Psychiatric/Behavioral: Negative.       Objective:   Physical Exam  Constitutional: She is oriented to person, place, and time. She appears well-developed and well-nourished.  HENT:  Head: Normocephalic and atraumatic.  Right Ear: External ear normal.  Left Ear: External ear normal.  Oropharynx with some mild erythema and clear drainage  Eyes: EOM are normal.  Neck: Normal range of motion.  Cardiovascular: Normal rate and regular rhythm.   Pulmonary/Chest: Effort normal and breath sounds normal. No respiratory distress. She has no wheezes. She has no rales.  Abdominal: Soft. Bowel sounds are normal. She exhibits no distension. There is no tenderness. There is no rebound.  Musculoskeletal: She exhibits no edema.  Neurological: She is alert and  oriented to person, place, and time. Coordination normal.  Skin: Skin is warm and dry.   Vitals:   09/01/15 0814  BP: 114/78  Pulse: 74  Resp: 16  Temp: 98.5 F (36.9 C)  TempSrc: Oral  SpO2: 97%  Weight: 166 lb (75.3 kg)  Height: 5\' 5"  (1.651 m)      Assessment & Plan:

## 2016-02-24 DIAGNOSIS — H8109 Meniere's disease, unspecified ear: Secondary | ICD-10-CM | POA: Diagnosis not present

## 2016-05-14 ENCOUNTER — Encounter: Payer: Self-pay | Admitting: Internal Medicine

## 2016-05-14 ENCOUNTER — Ambulatory Visit (INDEPENDENT_AMBULATORY_CARE_PROVIDER_SITE_OTHER): Payer: BLUE CROSS/BLUE SHIELD | Admitting: Internal Medicine

## 2016-05-14 ENCOUNTER — Ambulatory Visit (INDEPENDENT_AMBULATORY_CARE_PROVIDER_SITE_OTHER)
Admission: RE | Admit: 2016-05-14 | Discharge: 2016-05-14 | Disposition: A | Discharge status: Home / Self Care | Payer: BLUE CROSS/BLUE SHIELD | Source: Ambulatory Visit | Attending: Internal Medicine | Admitting: Internal Medicine

## 2016-05-14 VITALS — BP 110/70 | HR 67 | Temp 98.8°F | Resp 12 | Ht 65.0 in | Wt 155.0 lb

## 2016-05-14 DIAGNOSIS — M25562 Pain in left knee: Secondary | ICD-10-CM | POA: Diagnosis not present

## 2016-05-14 DIAGNOSIS — M25561 Pain in right knee: Secondary | ICD-10-CM | POA: Insufficient documentation

## 2016-05-14 MED ORDER — DICLOFENAC SODIUM 1 % TD GEL: 2.0000 g | Freq: Three times a day (TID) | TRANSDERMAL | 2 refills | Status: DC | PRN

## 2016-05-14 NOTE — Progress Notes (Signed)
Pre visit review using our clinic review tool, if applicable. No additional management support is needed unless otherwise documented below in the visit note. 

## 2016-05-14 NOTE — Progress Notes (Signed)
   Subjective:    Patient ID: Kimberly LecherRebecca Feigenbaum, female    DOB: Jul 02, 1980, 36 y.o.   MRN: [id number]  HPI The patient is a 36 YO female coming in for knot on the area below her left knee. Started several months ago. Is not painful with walking or sitting. Sometimes hurts when she is kneeling or squatting. She does this a lot with her 38 YO child. She denies rash on the skin or injury to the area. No old fractures or injury to the area. Not progressive over the last several months. Has not tried anything for it. No numbness or weakness or buckling.   Review of Systems  Constitutional: Negative.   Respiratory: Negative.   Cardiovascular: Negative.   Gastrointestinal: Negative.   Musculoskeletal: Positive for arthralgias and joint swelling. Negative for back pain, gait problem, myalgias, neck pain and neck stiffness.  Skin: Negative.   Neurological: Negative.       Objective:   Physical Exam  Constitutional: She is oriented to person, place, and time. She appears well-developed and well-nourished.  HENT:  Head: Normocephalic and atraumatic.  Eyes: EOM are normal.  Cardiovascular: Normal rate and regular rhythm.   Pulmonary/Chest: Effort normal and breath sounds normal.  Abdominal: Soft.  Musculoskeletal: She exhibits no edema or tenderness.  Mild prominence of tibia left when compared to right. Slight tenderness to palpation and feels bony. No effusion of the left knee or pain in the left knee.   Neurological: She is alert and oriented to person, place, and time.  Skin: Skin is warm and dry.   Vitals:   05/14/16 0758  BP: 110/70  Pulse: 67  Resp: 12  Temp: 98.8 F (37.1 C)  TempSrc: Oral  SpO2: 98%  Weight: 155 lb (70.3 kg)  Height: 5\' 5"  (1.651 m)      Assessment & Plan:

## 2016-05-14 NOTE — Patient Instructions (Signed)
We will check the x-ray today and call you back with the results.   We have sent in the cream which you can use up to 3 times a day for the next week or so to calm this down and then as needed.   Ice will be helpful as well.

## 2016-05-14 NOTE — Assessment & Plan Note (Signed)
Suspect bone spur on the tibia below the left knee and getting x-ray to verify. Rx for voltaren gel to use for anti-inflammatory acutely and then as needed. Advised padding and ice for pain if needed.

## 2016-08-29 DIAGNOSIS — Z6825 Body mass index (BMI) 25.0-25.9, adult: Secondary | ICD-10-CM | POA: Diagnosis not present

## 2016-08-29 DIAGNOSIS — Z01419 Encounter for gynecological examination (general) (routine) without abnormal findings: Secondary | ICD-10-CM | POA: Diagnosis not present

## 2016-08-30 LAB — HM PAP SMEAR

## 2016-10-28 DIAGNOSIS — N926 Irregular menstruation, unspecified: Secondary | ICD-10-CM | POA: Diagnosis not present

## 2016-10-28 DIAGNOSIS — N760 Acute vaginitis: Secondary | ICD-10-CM | POA: Diagnosis not present

## 2016-11-19 DIAGNOSIS — N939 Abnormal uterine and vaginal bleeding, unspecified: Secondary | ICD-10-CM | POA: Diagnosis not present

## 2016-11-19 DIAGNOSIS — N840 Polyp of corpus uteri: Secondary | ICD-10-CM | POA: Diagnosis not present

## 2016-11-19 DIAGNOSIS — N83291 Other ovarian cyst, right side: Secondary | ICD-10-CM | POA: Diagnosis not present

## 2016-11-19 DIAGNOSIS — N83201 Unspecified ovarian cyst, right side: Secondary | ICD-10-CM | POA: Diagnosis not present

## 2016-12-26 DIAGNOSIS — N83299 Other ovarian cyst, unspecified side: Secondary | ICD-10-CM | POA: Diagnosis not present

## 2016-12-26 DIAGNOSIS — N840 Polyp of corpus uteri: Secondary | ICD-10-CM | POA: Diagnosis not present

## 2017-02-04 DIAGNOSIS — N840 Polyp of corpus uteri: Secondary | ICD-10-CM | POA: Diagnosis not present

## 2017-02-04 DIAGNOSIS — N858 Other specified noninflammatory disorders of uterus: Secondary | ICD-10-CM | POA: Diagnosis not present

## 2017-02-04 DIAGNOSIS — D259 Leiomyoma of uterus, unspecified: Secondary | ICD-10-CM | POA: Diagnosis not present

## 2017-02-13 ENCOUNTER — Encounter (HOSPITAL_BASED_OUTPATIENT_CLINIC_OR_DEPARTMENT_OTHER): Payer: Self-pay

## 2017-02-13 ENCOUNTER — Ambulatory Visit (HOSPITAL_BASED_OUTPATIENT_CLINIC_OR_DEPARTMENT_OTHER): Admit: 2017-02-13 | Payer: BLUE CROSS/BLUE SHIELD | Admitting: Obstetrics & Gynecology

## 2017-02-13 DIAGNOSIS — N72 Inflammatory disease of cervix uteri: Secondary | ICD-10-CM | POA: Diagnosis not present

## 2017-02-13 DIAGNOSIS — N840 Polyp of corpus uteri: Secondary | ICD-10-CM | POA: Diagnosis not present

## 2017-02-13 DIAGNOSIS — N920 Excessive and frequent menstruation with regular cycle: Secondary | ICD-10-CM | POA: Diagnosis not present

## 2017-02-13 SURGERY — HYSTEROSCOPY
Anesthesia: General | Laterality: Right

## 2017-02-21 DIAGNOSIS — F411 Generalized anxiety disorder: Secondary | ICD-10-CM | POA: Diagnosis not present

## 2017-03-19 DIAGNOSIS — D485 Neoplasm of uncertain behavior of skin: Secondary | ICD-10-CM | POA: Diagnosis not present

## 2017-03-19 DIAGNOSIS — D225 Melanocytic nevi of trunk: Secondary | ICD-10-CM | POA: Diagnosis not present

## 2017-03-19 DIAGNOSIS — Z1283 Encounter for screening for malignant neoplasm of skin: Secondary | ICD-10-CM | POA: Diagnosis not present

## 2017-03-28 DIAGNOSIS — S21209A Unspecified open wound of unspecified back wall of thorax without penetration into thoracic cavity, initial encounter: Secondary | ICD-10-CM | POA: Diagnosis not present

## 2017-07-24 DIAGNOSIS — Z1231 Encounter for screening mammogram for malignant neoplasm of breast: Secondary | ICD-10-CM | POA: Diagnosis not present

## 2017-07-24 LAB — HM MAMMOGRAPHY

## 2017-07-29 ENCOUNTER — Encounter: Payer: Self-pay | Admitting: Internal Medicine

## 2017-07-29 NOTE — Progress Notes (Signed)
Abstracted and sent to scan  

## 2017-09-11 DIAGNOSIS — Z6824 Body mass index (BMI) 24.0-24.9, adult: Secondary | ICD-10-CM | POA: Diagnosis not present

## 2017-09-11 DIAGNOSIS — N390 Urinary tract infection, site not specified: Secondary | ICD-10-CM | POA: Diagnosis not present

## 2017-09-11 DIAGNOSIS — R300 Dysuria: Secondary | ICD-10-CM | POA: Diagnosis not present

## 2017-09-11 DIAGNOSIS — Z01419 Encounter for gynecological examination (general) (routine) without abnormal findings: Secondary | ICD-10-CM | POA: Diagnosis not present

## 2017-09-15 ENCOUNTER — Other Ambulatory Visit: Payer: Self-pay | Admitting: Obstetrics & Gynecology

## 2017-09-15 DIAGNOSIS — N6320 Unspecified lump in the left breast, unspecified quadrant: Secondary | ICD-10-CM

## 2017-09-17 ENCOUNTER — Other Ambulatory Visit: Payer: BLUE CROSS/BLUE SHIELD

## 2017-09-22 ENCOUNTER — Ambulatory Visit
Admission: RE | Admit: 2017-09-22 | Discharge: 2017-09-22 | Disposition: A | Discharge status: Home / Self Care | Payer: BLUE CROSS/BLUE SHIELD | Source: Ambulatory Visit | Attending: Obstetrics & Gynecology | Admitting: Obstetrics & Gynecology

## 2017-09-22 ENCOUNTER — Other Ambulatory Visit: Payer: Self-pay | Admitting: Obstetrics & Gynecology

## 2017-09-22 DIAGNOSIS — R922 Inconclusive mammogram: Secondary | ICD-10-CM | POA: Diagnosis not present

## 2017-09-22 DIAGNOSIS — N6320 Unspecified lump in the left breast, unspecified quadrant: Secondary | ICD-10-CM

## 2017-09-30 DIAGNOSIS — L259 Unspecified contact dermatitis, unspecified cause: Secondary | ICD-10-CM | POA: Diagnosis not present

## 2017-10-01 ENCOUNTER — Ambulatory Visit (INDEPENDENT_AMBULATORY_CARE_PROVIDER_SITE_OTHER): Payer: BLUE CROSS/BLUE SHIELD | Admitting: Family

## 2017-10-01 ENCOUNTER — Encounter: Payer: Self-pay | Admitting: Family

## 2017-10-01 VITALS — BP 108/68 | HR 71 | Temp 98.0°F | Ht 65.0 in | Wt 148.1 lb

## 2017-10-01 DIAGNOSIS — L255 Unspecified contact dermatitis due to plants, except food: Secondary | ICD-10-CM

## 2017-10-01 MED ORDER — HYDROXYZINE HCL 10 MG PO TABS: 10.0000 mg | ORAL_TABLET | Freq: Three times a day (TID) | ORAL | 0 refills | Status: DC | PRN

## 2017-10-01 MED ORDER — RANITIDINE HCL 150 MG PO TABS: 150.0000 mg | ORAL_TABLET | Freq: Two times a day (BID) | ORAL | 0 refills | Status: DC

## 2017-10-01 NOTE — Progress Notes (Signed)
Kimberly Galloway is a 37 y.o. female with the following history as recorded in [employer]:  Patient Active Problem List   Diagnosis Date Noted  . Acute pain of left knee 05/14/2016  . Throat irritation 09/01/2015  . Routine general medical examination at a health care facility 12/21/2014    Current Outpatient Medications  Medication Sig Dispense Refill  . FLUoxetine (PROZAC) 20 MG capsule TK 1 C PO D  11  . Multiple Vitamins-Minerals (MULTIVITAMIN WITH MINERALS) tablet Take 1 tablet by mouth daily.    . predniSONE (DELTASONE) 20 MG tablet     . triamcinolone cream (KENALOG) 0.1 %     . hydrOXYzine (ATARAX/VISTARIL) 10 MG tablet Take 1 tablet (10 mg total) by mouth 3 (three) times daily as needed. 30 tablet 0  . ranitidine (ZANTAC) 150 MG tablet Take 1 tablet (150 mg total) by mouth 2 (two) times daily. 30 tablet 0   No current facility-administered medications for this visit.     Allergies: Patient has no known allergies.  Past Medical History:  Diagnosis Date  . Chlamydia   . HSV (herpes simplex virus) infection   . Medical history non-contributory     Past Surgical History:  Procedure Laterality Date  . Uterine septum removed  2010  . WISDOM TOOTH EXTRACTION      Family History  Problem Relation Age of Onset  . Arthritis Mother   . Alcohol abuse Father   . Hypertension Father   . Cancer Father        thyroid  . Arthritis Maternal Grandmother   . Cancer Maternal Grandmother        breast  . Stroke Maternal Grandmother   . Breast cancer Maternal Grandmother   . Arthritis Maternal Grandfather   . Heart disease Maternal Grandfather   . Arthritis Paternal Grandmother   . Stroke Paternal Grandmother   . Arthritis Paternal Grandfather   . Heart disease Paternal Grandfather   . Hypertension Paternal Grandfather     Social History   Tobacco Use  . Smoking status: Former Games developer  . Smokeless tobacco: Never Used  Substance Use Topics  . Alcohol use: Yes    Comment:  Occas. when not preg.    Subjective:  Seen yesterday at [facility] and diagnosed with contact dermatitis; was discharged on prednisone taper; has taken 60 mg of Prednisone yesterday and today and worried that rash is continuing to spread; worried she was mis-diagnosed; no relief with Benadryl;   Objective:  Vitals:   10/01/17 1017  BP: 108/68  Pulse: 71  Temp: 98 F (36.7 C)  TempSrc: Oral  SpO2: 98%  Weight: 148 lb 1.3 oz (67.2 kg)  Height: 5\' 5"  (1.651 m)    General: Well developed, well nourished, in no acute distress  Skin : Warm and dry. Extensive contact dermatitis noted on upper and lower extremities;  Head: Normocephalic and atraumatic  Lungs: Respirations unlabored;  Neurologic: Alert and oriented; speech intact; face symmetrical; moves all extremities well; CNII-XII intact without focal deficit   Assessment:  1. Contact dermatitis due to plants, except food, unspecified contact dermatitis type     Plan:  Reassurance this is not scabies; agree with diagnosis made at [facility] yesterday; add Zantac and Hydroxyzine for itching; take prednisone as prescribed; do not feel antibiotics are needed; follow-up worse, no better.   No follow-ups on file.  No orders of the defined types were placed in this encounter.   Requested Prescriptions   Signed Prescriptions Disp  Refills  . hydrOXYzine (ATARAX/VISTARIL) 10 MG tablet 30 tablet 0    Sig: Take 1 tablet (10 mg total) by mouth 3 (three) times daily as needed.  . ranitidine (ZANTAC) 150 MG tablet 30 tablet 0    Sig: Take 1 tablet (150 mg total) by mouth 2 (two) times daily.

## 2017-10-03 ENCOUNTER — Encounter: Payer: Self-pay | Admitting: Family

## 2017-10-03 ENCOUNTER — Telehealth: Payer: Self-pay | Admitting: Internal Medicine

## 2017-10-03 ENCOUNTER — Ambulatory Visit (INDEPENDENT_AMBULATORY_CARE_PROVIDER_SITE_OTHER): Payer: BLUE CROSS/BLUE SHIELD | Admitting: Family

## 2017-10-03 VITALS — BP 116/78 | HR 76 | Temp 98.3°F | Ht 65.0 in

## 2017-10-03 DIAGNOSIS — L255 Unspecified contact dermatitis due to plants, except food: Secondary | ICD-10-CM

## 2017-10-03 DIAGNOSIS — L299 Pruritus, unspecified: Secondary | ICD-10-CM | POA: Diagnosis not present

## 2017-10-03 MED ORDER — METHYLPREDNISOLONE ACETATE 80 MG/ML IJ SUSP
80.0000 mg | Freq: Once | INTRAMUSCULAR | Status: AC
  Administered 2017-10-03: 80 mg via INTRAMUSCULAR

## 2017-10-03 MED ORDER — PREDNISONE 20 MG PO TABS: ORAL_TABLET | ORAL | 0 refills | Status: DC

## 2017-10-03 NOTE — Telephone Encounter (Signed)
Copied from CRM [id number]. Topic: Quick Communication - See Telephone Encounter >> Oct 03, 2017  8:16 AM Tamela Oddi, NT wrote: CRM for notification. See Telephone encounter for: 10/03/17. Patient called and states she seen Vernona Rieger on 10/01/17 for a rash. She was taking Prednisone and it is still not working for the rash. It is spreading. Patient is wondering if she can get another RX for Prednisone. Patient states Vernona Rieger told her to call if it was not getting any better. CB# [phone]

## 2017-10-03 NOTE — Telephone Encounter (Signed)
Is there anyway she could come in? We could do an injection of the steroids today and then more oral steroids starting tomorrow.

## 2017-10-03 NOTE — Telephone Encounter (Signed)
Spoke with patient and she is coming in today for appointment

## 2017-10-06 ENCOUNTER — Other Ambulatory Visit: Payer: Self-pay | Admitting: Family

## 2017-10-06 ENCOUNTER — Encounter: Payer: Self-pay | Admitting: Family

## 2017-10-06 DIAGNOSIS — L259 Unspecified contact dermatitis, unspecified cause: Secondary | ICD-10-CM

## 2017-10-06 MED ORDER — HYDROXYZINE HCL 10 MG PO TABS: 10.0000 mg | ORAL_TABLET | Freq: Four times a day (QID) | ORAL | 0 refills | Status: DC | PRN

## 2017-10-06 NOTE — Progress Notes (Signed)
Kimberly Galloway is a 37 y.o. female with the following history as recorded in [employer]:  Patient Active Problem List   Diagnosis Date Noted  . Acute pain of left knee 05/14/2016  . Throat irritation 09/01/2015  . Routine general medical examination at a health care facility 12/21/2014    Current Outpatient Medications  Medication Sig Dispense Refill  . FLUoxetine (PROZAC) 20 MG capsule TK 1 C PO D  11  . hydrOXYzine (ATARAX/VISTARIL) 10 MG tablet Take 1 tablet (10 mg total) by mouth 3 (three) times daily as needed. 30 tablet 0  . Multiple Vitamins-Minerals (MULTIVITAMIN WITH MINERALS) tablet Take 1 tablet by mouth daily.    . predniSONE (DELTASONE) 20 MG tablet     . ranitidine (ZANTAC) 150 MG tablet Take 1 tablet (150 mg total) by mouth 2 (two) times daily. 30 tablet 0  . triamcinolone cream (KENALOG) 0.1 %     . predniSONE (DELTASONE) 20 MG tablet Take 3 tablets x 3 days; take 2 tablets x 3 days; take 1 tablet x 3 days; take 1/2 tablet x 3 days; 21 tablet 0   No current facility-administered medications for this visit.     Allergies: Patient has no known allergies.  Past Medical History:  Diagnosis Date  . Chlamydia   . HSV (herpes simplex virus) infection   . Medical history non-contributory     Past Surgical History:  Procedure Laterality Date  . Uterine septum removed  2010  . WISDOM TOOTH EXTRACTION      Family History  Problem Relation Age of Onset  . Arthritis Mother   . Alcohol abuse Father   . Hypertension Father   . Cancer Father        thyroid  . Arthritis Maternal Grandmother   . Cancer Maternal Grandmother        breast  . Stroke Maternal Grandmother   . Breast cancer Maternal Grandmother   . Arthritis Maternal Grandfather   . Heart disease Maternal Grandfather   . Arthritis Paternal Grandmother   . Stroke Paternal Grandmother   . Arthritis Paternal Grandfather   . Heart disease Paternal Grandfather   . Hypertension Paternal Grandfather     Social  History   Tobacco Use  . Smoking status: Former Games developer  . Smokeless tobacco: Never Used  Substance Use Topics  . Alcohol use: Yes    Comment: Occas. when not preg.    Subjective:  Seen on 10/01/17 with concerns for contact dermatitis; had been to [facility] on 9/24 and started on prednisone; notes that original lesions are improving but is concerned about persisting outbreaks; extreme itching; has been doing yard work- pulled large amount of vines; dogs also go outside and may have been source of exposure; no other family members with similar symptoms; denies any new soaps, foods, detergents or medications.    Objective:  Vitals:   10/03/17 1446  BP: 116/78  Pulse: 76  Temp: 98.3 F (36.8 C)  TempSrc: Oral  SpO2: 97%  Height: 5\' 5"  (1.651 m)    General: Well developed, well nourished, in no acute distress  Skin : Warm and dry. Linear vesicular lesions noted on upper extremities, lower extremities, trunk Head: Normocephalic and atraumatic  Lungs: Respirations unlabored; Neurologic: Alert and oriented; speech intact; face symmetrical; moves all extremities well; CNII-XII intact without focal deficit  Assessment:  1. Contact dermatitis due to plants, except food, unspecified contact dermatitis type   2. Itching     Plan:  Depo-Medrol  IM 80 mg given; start prednisone taper tomorrow; may need to refer to dermatology;    No follow-ups on file.  No orders of the defined types were placed in this encounter.   Requested Prescriptions   Signed Prescriptions Disp Refills  . predniSONE (DELTASONE) 20 MG tablet 21 tablet 0    Sig: Take 3 tablets x 3 days; take 2 tablets x 3 days; take 1 tablet x 3 days; take 1/2 tablet x 3 days;

## 2017-10-07 DIAGNOSIS — L309 Dermatitis, unspecified: Secondary | ICD-10-CM | POA: Diagnosis not present

## 2017-10-07 DIAGNOSIS — L239 Allergic contact dermatitis, unspecified cause: Secondary | ICD-10-CM | POA: Diagnosis not present

## 2017-11-26 DIAGNOSIS — N760 Acute vaginitis: Secondary | ICD-10-CM | POA: Diagnosis not present

## 2017-11-30 DIAGNOSIS — J029 Acute pharyngitis, unspecified: Secondary | ICD-10-CM | POA: Diagnosis not present

## 2018-01-20 DIAGNOSIS — Z9189 Other specified personal risk factors, not elsewhere classified: Secondary | ICD-10-CM | POA: Diagnosis not present

## 2018-01-20 DIAGNOSIS — R922 Inconclusive mammogram: Secondary | ICD-10-CM | POA: Diagnosis not present

## 2018-01-20 DIAGNOSIS — Z803 Family history of malignant neoplasm of breast: Secondary | ICD-10-CM | POA: Diagnosis not present

## 2018-01-20 DIAGNOSIS — Z1239 Encounter for other screening for malignant neoplasm of breast: Secondary | ICD-10-CM | POA: Diagnosis not present

## 2018-03-04 DIAGNOSIS — Z1239 Encounter for other screening for malignant neoplasm of breast: Secondary | ICD-10-CM | POA: Diagnosis not present

## 2018-03-04 DIAGNOSIS — R922 Inconclusive mammogram: Secondary | ICD-10-CM | POA: Diagnosis not present

## 2018-03-04 DIAGNOSIS — R928 Other abnormal and inconclusive findings on diagnostic imaging of breast: Secondary | ICD-10-CM | POA: Diagnosis not present

## 2018-03-04 DIAGNOSIS — N6459 Other signs and symptoms in breast: Secondary | ICD-10-CM | POA: Diagnosis not present

## 2018-03-04 DIAGNOSIS — Z803 Family history of malignant neoplasm of breast: Secondary | ICD-10-CM | POA: Diagnosis not present

## 2018-03-04 DIAGNOSIS — N6321 Unspecified lump in the left breast, upper outer quadrant: Secondary | ICD-10-CM | POA: Diagnosis not present

## 2018-03-04 DIAGNOSIS — Z9189 Other specified personal risk factors, not elsewhere classified: Secondary | ICD-10-CM | POA: Diagnosis not present

## 2018-03-12 DIAGNOSIS — N6320 Unspecified lump in the left breast, unspecified quadrant: Secondary | ICD-10-CM | POA: Diagnosis not present

## 2018-03-12 DIAGNOSIS — N6321 Unspecified lump in the left breast, upper outer quadrant: Secondary | ICD-10-CM | POA: Diagnosis not present

## 2018-03-25 ENCOUNTER — Other Ambulatory Visit: Payer: BLUE CROSS/BLUE SHIELD

## 2018-06-05 DIAGNOSIS — Z03818 Encounter for observation for suspected exposure to other biological agents ruled out: Secondary | ICD-10-CM | POA: Diagnosis not present

## 2018-06-16 IMAGING — DX DG KNEE COMPLETE 4+V*L*
4 series · 4 of 4 positions shown · non-contrast
Comparison: None in PACs

CLINICAL DATA: Intermittent left knee pain for the past 2 months.
Symptoms are greatest with bending. No known injury.

EXAM:
LEFT KNEE - COMPLETE 4+ VIEW

[knee ap]
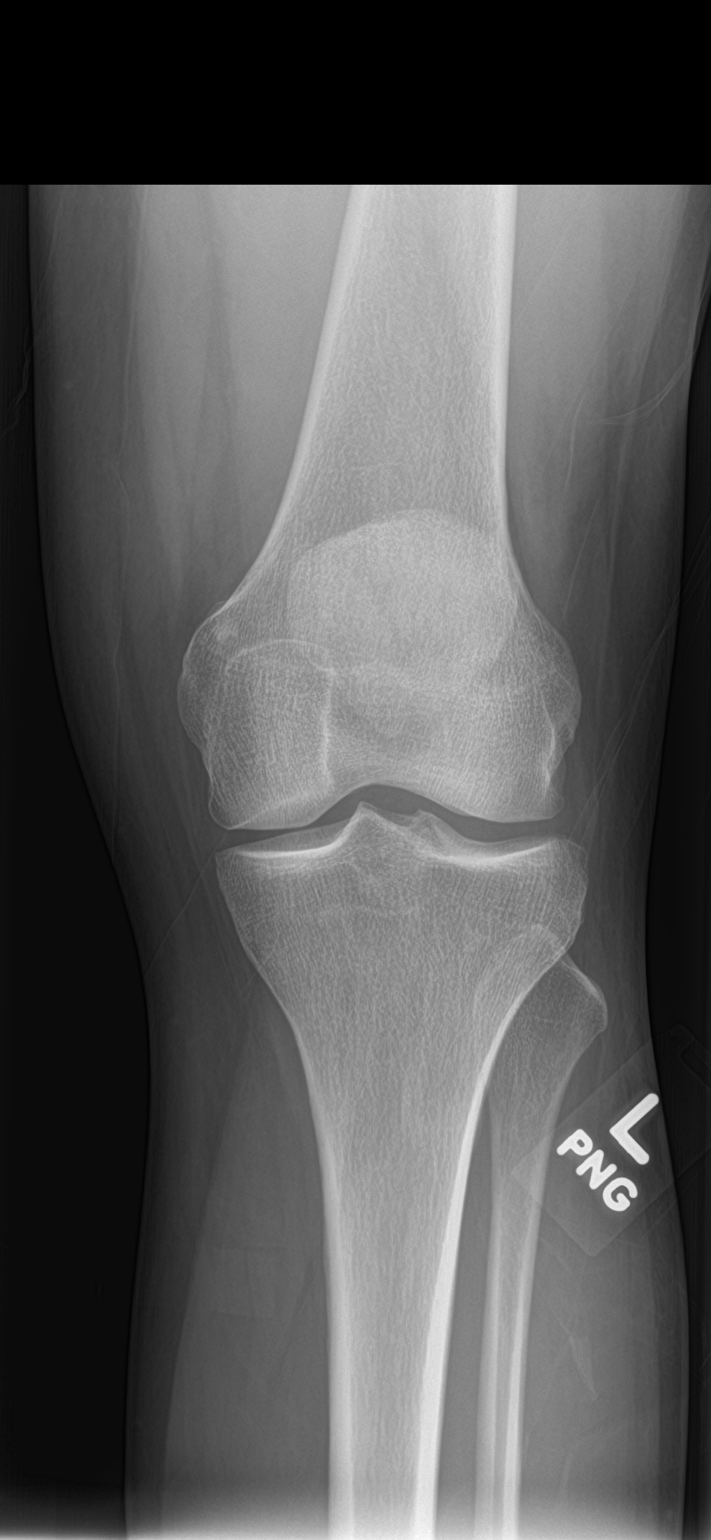

[knee tunnel]
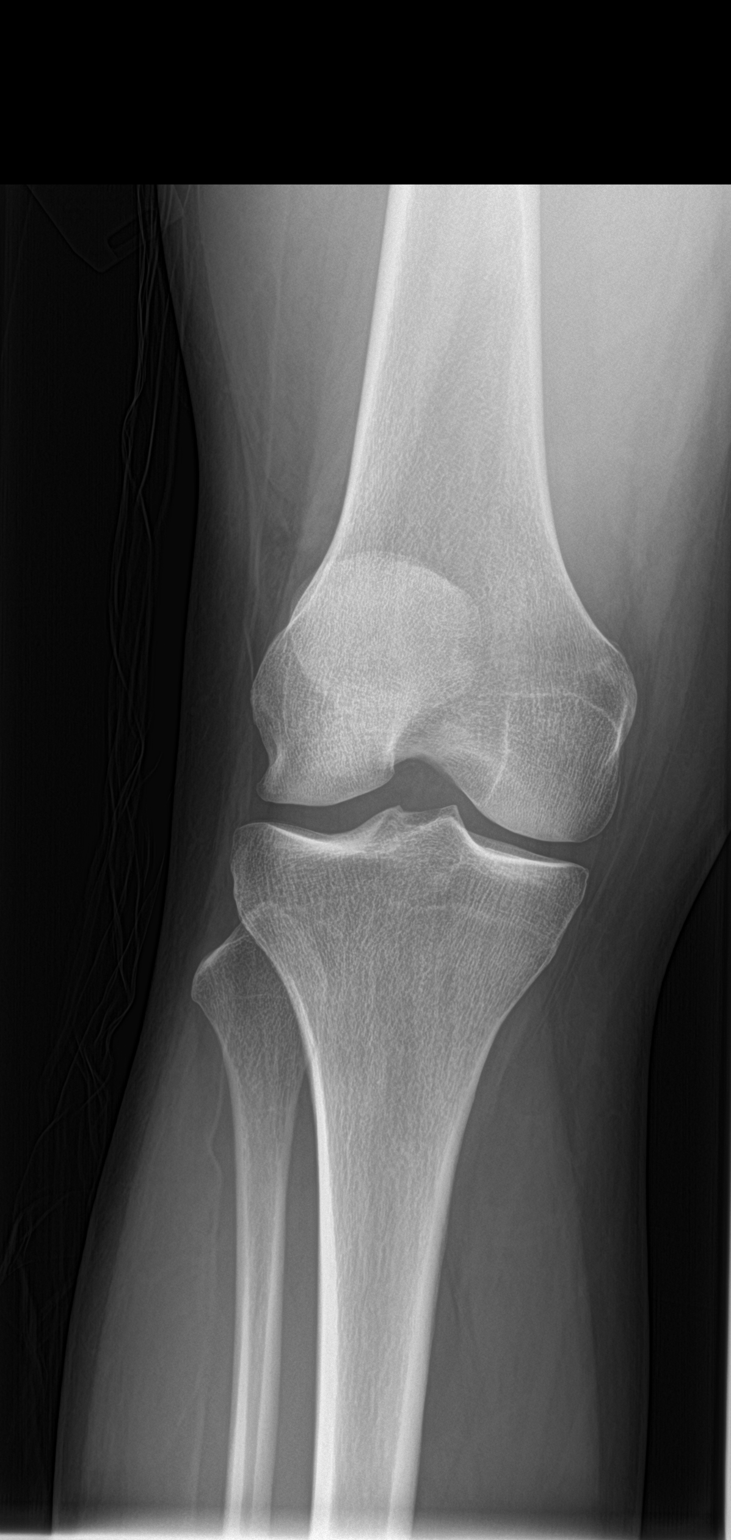

[knee lat]
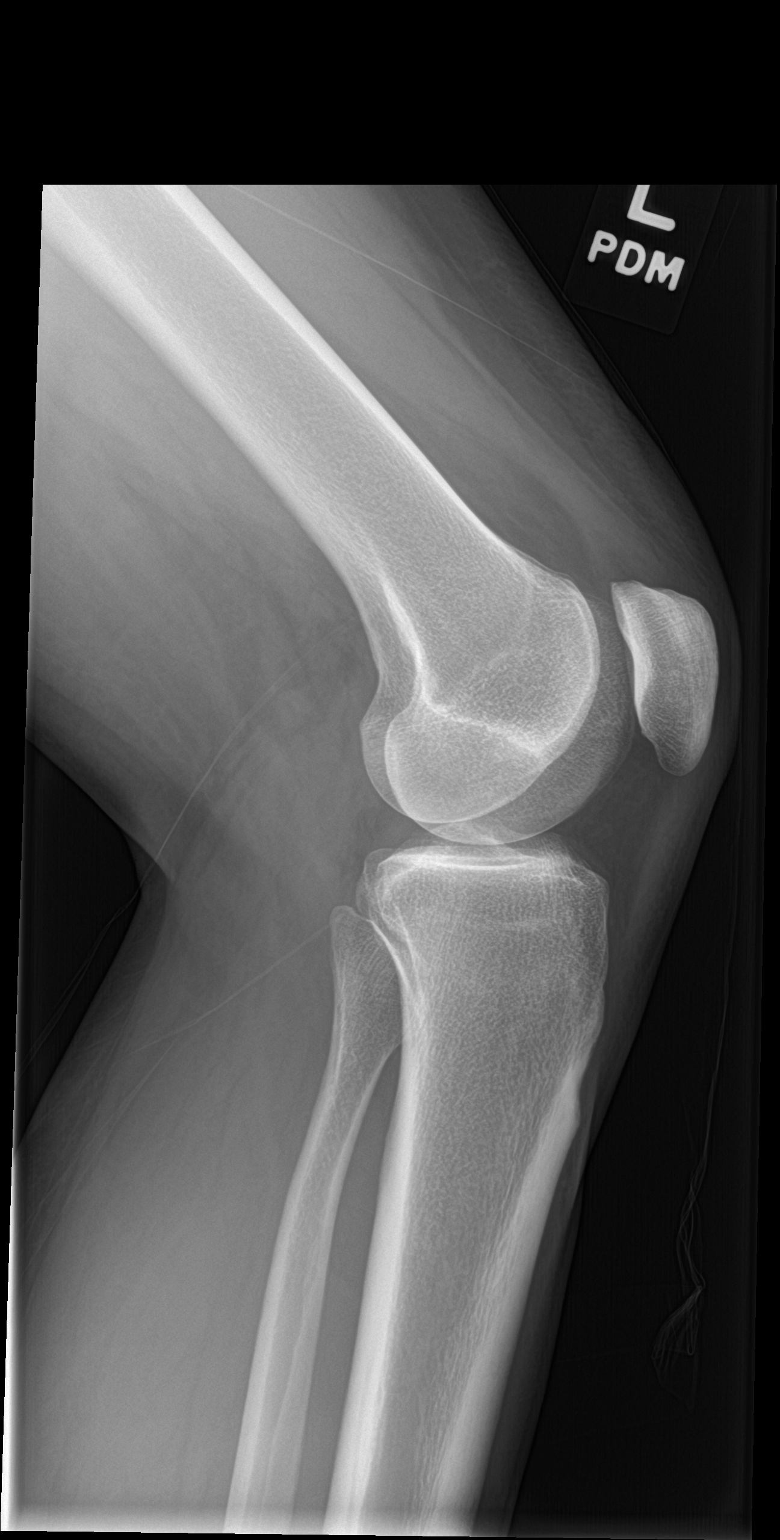

[sunrise]
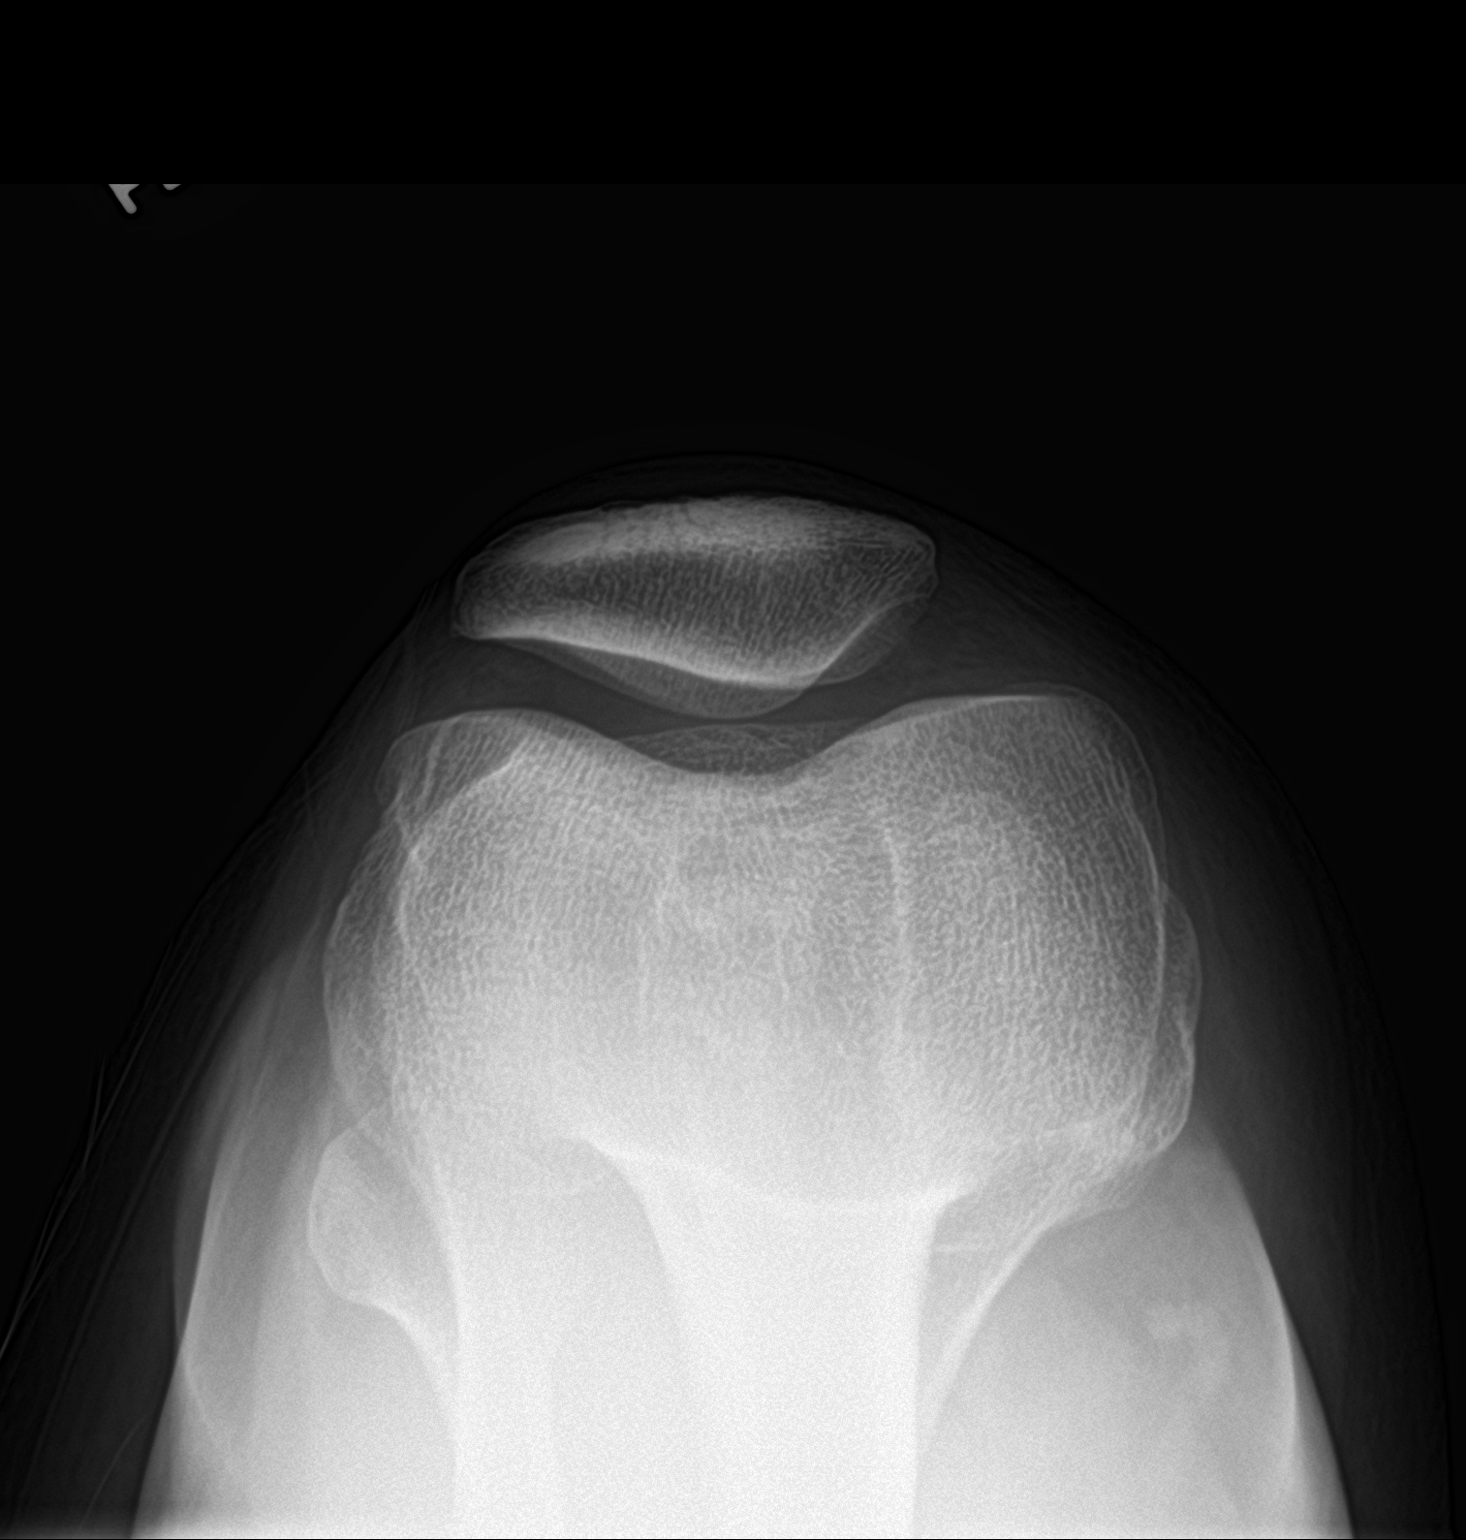

[4 of 4 positions shown; findings below may reference images not displayed]

FINDINGS: The bones are subjectively adequately mineralized. There is no acute
fracture nor dislocation. There is mild beaking of the tibial
spines. There no lytic or blastic lesion. There is no joint
effusion.
IMPRESSION: Minimal beaking of the tibial spines may reflect early
osteoarthritic change. No joint space loss or other evidence of
significant degenerative change. There is no acute bony abnormality.

## 2018-09-08 ENCOUNTER — Ambulatory Visit (INDEPENDENT_AMBULATORY_CARE_PROVIDER_SITE_OTHER): Payer: BC Managed Care – PPO | Admitting: Internal Medicine

## 2018-09-08 ENCOUNTER — Other Ambulatory Visit (INDEPENDENT_AMBULATORY_CARE_PROVIDER_SITE_OTHER): Payer: BC Managed Care – PPO

## 2018-09-08 ENCOUNTER — Encounter: Payer: Self-pay | Admitting: Internal Medicine

## 2018-09-08 ENCOUNTER — Other Ambulatory Visit: Payer: Self-pay

## 2018-09-08 VITALS — BP 120/86 | HR 56 | Temp 97.9°F | Ht 65.0 in | Wt 161.0 lb

## 2018-09-08 DIAGNOSIS — Z0000 Encounter for general adult medical examination without abnormal findings: Secondary | ICD-10-CM | POA: Diagnosis not present

## 2018-09-08 DIAGNOSIS — M25561 Pain in right knee: Secondary | ICD-10-CM

## 2018-09-08 LAB — COMPREHENSIVE METABOLIC PANEL
ALT: 14 U/L (ref 0–35)
AST: 18 U/L (ref 0–37)
Albumin: 4.6 g/dL (ref 3.5–5.2)
Alkaline Phosphatase: 38 U/L — ABNORMAL LOW (ref 39–117)
BUN: 16 mg/dL (ref 6–23)
CO2: 27 mEq/L (ref 19–32)
Calcium: 9.6 mg/dL (ref 8.4–10.5)
Chloride: 101 mEq/L (ref 96–112)
Creatinine, Ser: 0.84 mg/dL (ref 0.40–1.20)
GFR: 74.89 mL/min (ref >60.00)
Glucose, Bld: 88 mg/dL (ref 70–99)
Potassium: 4.1 mEq/L (ref 3.5–5.1)
Sodium: 137 mEq/L (ref 135–145)
Total Bilirubin: 0.9 mg/dL (ref 0.2–1.2)
Total Protein: 7.5 g/dL (ref 6.0–8.3)

## 2018-09-08 LAB — CBC
HCT: 39.8 % (ref 36.0–46.0)
Hemoglobin: 13.3 g/dL (ref 12.0–15.0)
MCHC: 33.4 g/dL (ref 30.0–36.0)
MCV: 92.5 fl (ref 78.0–100.0)
Platelets: 313 10*3/uL (ref 150.0–400.0)
RBC: 4.3 Mil/uL (ref 3.87–5.11)
RDW: 13.2 % (ref 11.5–15.5)
WBC: 6.5 10*3/uL (ref 4.0–10.5)

## 2018-09-08 LAB — TSH: TSH: 2.11 u[IU]/mL (ref 0.35–4.50)

## 2018-09-08 LAB — LIPID PANEL
Cholesterol: 239 mg/dL — ABNORMAL HIGH (ref 0–200)
HDL: 91 mg/dL (ref >39.00)
LDL Cholesterol: 131 mg/dL — ABNORMAL HIGH (ref 0–99)
NonHDL: 147.62
Total CHOL/HDL Ratio: 3
Triglycerides: 85 mg/dL (ref 0.0–149.0)
VLDL: 17 mg/dL (ref 0.0–40.0)

## 2018-09-08 LAB — VITAMIN D 25 HYDROXY (VIT D DEFICIENCY, FRACTURES): VITD: 40.03 ng/mL (ref 30.00–100.00)

## 2018-09-08 NOTE — Patient Instructions (Signed)
Health Maintenance, Female Adopting a healthy lifestyle and getting preventive care are important in promoting health and wellness. Ask your health care provider about:  The right schedule for you to have regular tests and exams.  Things you can do on your own to prevent diseases and keep yourself healthy. What should I know about diet, weight, and exercise? Eat a healthy diet   Eat a diet that includes plenty of vegetables, fruits, low-fat dairy products, and lean protein.  Do not eat a lot of foods that are high in solid fats, added sugars, or sodium. Maintain a healthy weight Body mass index (BMI) is used to identify weight problems. It estimates body fat based on height and weight. Your health care provider can help determine your BMI and help you achieve or maintain a healthy weight. Get regular exercise Get regular exercise. This is one of the most important things you can do for your health. Most adults should:  Exercise for at least 150 minutes each week. The exercise should increase your heart rate and make you sweat (moderate-intensity exercise).  Do strengthening exercises at least twice a week. This is in addition to the moderate-intensity exercise.  Spend less time sitting. Even light physical activity can be beneficial. Watch cholesterol and blood lipids Have your blood tested for lipids and cholesterol at 40 years of age, then have this test every 5 years. Have your cholesterol levels checked more often if:  Your lipid or cholesterol levels are high.  You are older than 40 years of age.  You are at high risk for heart disease. What should I know about cancer screening? Depending on your health history and family history, you may need to have cancer screening at various ages. This may include screening for:  Breast cancer.  Cervical cancer.  Colorectal cancer.  Skin cancer.  Lung cancer. What should I know about heart disease, diabetes, and high blood  pressure? Blood pressure and heart disease  High blood pressure causes heart disease and increases the risk of stroke. This is more likely to develop in people who have high blood pressure readings, are of African descent, or are overweight.  Have your blood pressure checked: ? Every 3-5 years if you are 18-39 years of age. ? Every year if you are 40 years old or older. Diabetes Have regular diabetes screenings. This checks your fasting blood sugar level. Have the screening done:  Once every three years after age 40 if you are at a normal weight and have a low risk for diabetes.  More often and at a younger age if you are overweight or have a high risk for diabetes. What should I know about preventing infection? Hepatitis B If you have a higher risk for hepatitis B, you should be screened for this virus. Talk with your health care provider to find out if you are at risk for hepatitis B infection. Hepatitis C Testing is recommended for:  Everyone born from 1945 through 1965.  Anyone with known risk factors for hepatitis C. Sexually transmitted infections (STIs)  Get screened for STIs, including gonorrhea and chlamydia, if: ? You are sexually active and are younger than 40 years of age. ? You are older than 40 years of age and your health care provider tells you that you are at risk for this type of infection. ? Your sexual activity has changed since you were last screened, and you are at increased risk for chlamydia or gonorrhea. Ask your health care provider if   you are at risk.  Ask your health care provider about whether you are at high risk for HIV. Your health care provider may recommend a prescription medicine to help prevent HIV infection. If you choose to take medicine to prevent HIV, you should first get tested for HIV. You should then be tested every 3 months for as long as you are taking the medicine. Pregnancy  If you are about to stop having your period (premenopausal) and  you may become pregnant, seek counseling before you get pregnant.  Take 400 to 800 micrograms (mcg) of folic acid every day if you become pregnant.  Ask for birth control (contraception) if you want to prevent pregnancy. Osteoporosis and menopause Osteoporosis is a disease in which the bones lose minerals and strength with aging. This can result in bone fractures. If you are 65 years old or older, or if you are at risk for osteoporosis and fractures, ask your health care provider if you should:  Be screened for bone loss.  Take a calcium or vitamin D supplement to lower your risk of fractures.  Be given hormone replacement therapy (HRT) to treat symptoms of menopause. Follow these instructions at home: Lifestyle  Do not use any products that contain nicotine or tobacco, such as cigarettes, e-cigarettes, and chewing tobacco. If you need help quitting, ask your health care provider.  Do not use street drugs.  Do not share needles.  Ask your health care provider for help if you need support or information about quitting drugs. Alcohol use  Do not drink alcohol if: ? Your health care provider tells you not to drink. ? You are pregnant, may be pregnant, or are planning to become pregnant.  If you drink alcohol: ? Limit how much you use to 0-1 drink a day. ? Limit intake if you are breastfeeding.  Be aware of how much alcohol is in your drink. In the [place] one drink equals one 12 oz bottle of beer (355 mL), one 5 oz glass of wine (148 mL), or one 1 oz glass of hard liquor (44 mL). General instructions  Schedule regular health, dental, and eye exams.  Stay current with your vaccines.  Tell your health care provider if: ? You often feel depressed. ? You have ever been abused or do not feel safe at home. Summary  Adopting a healthy lifestyle and getting preventive care are important in promoting health and wellness.  Follow your health care provider's instructions about healthy  diet, exercising, and getting tested or screened for diseases.  Follow your health care provider's instructions on monitoring your cholesterol and blood pressure. This information is not intended to replace advice given to you by your health care provider. Make sure you discuss any questions you have with your health care provider. Document Released: 07/09/2010 Document Revised: 12/17/2017 Document Reviewed: 12/17/2017 Elsevier Patient Education  2020 [employer]  

## 2018-09-08 NOTE — Assessment & Plan Note (Signed)
Flu shot declines. Tetanus up to date. Mammogram up to date, pap smear up to date with gyn getting records. Counseled about sun safety and mole surveillance. Counseled about the dangers of distracted driving. Given 10 year screening recommendations.

## 2018-09-08 NOTE — Assessment & Plan Note (Signed)
Likely pre-patellar bursitis. Will refer to sports medicine for injection.

## 2018-09-08 NOTE — Progress Notes (Signed)
   Subjective:   Patient ID: Kimberly Galloway, female    DOB: 02/19/1980, 38 y.o.   MRN: [id number]  HPI The patient is a 38 YO female coming in for physical.   PMH, Sharp, social history reviewed and updated  Review of Systems  Constitutional: Negative.   HENT: Negative.   Eyes: Negative.   Respiratory: Negative for cough, chest tightness and shortness of breath.   Cardiovascular: Negative for chest pain, palpitations and leg swelling.  Gastrointestinal: Negative for abdominal distention, abdominal pain, constipation, diarrhea, nausea and vomiting.  Musculoskeletal: Positive for arthralgias.  Skin: Negative.   Neurological: Negative.   Psychiatric/Behavioral: Negative.     Objective:  Physical Exam Constitutional:      Appearance: She is well-developed.  HENT:     Head: Normocephalic and atraumatic.  Neck:     Musculoskeletal: Normal range of motion.  Cardiovascular:     Rate and Rhythm: Normal rate and regular rhythm.  Pulmonary:     Effort: Pulmonary effort is normal. No respiratory distress.     Breath sounds: Normal breath sounds. No wheezing or rales.  Abdominal:     General: Bowel sounds are normal. There is no distension.     Palpations: Abdomen is soft.     Tenderness: There is no abdominal tenderness. There is no rebound.  Musculoskeletal:     Comments: Right knee pain, directly inferior to the patella and slightly lateral  Skin:    General: Skin is warm and dry.  Neurological:     Mental Status: She is alert and oriented to person, place, and time.     Coordination: Coordination normal.     Vitals:   09/08/18 1434  BP: 120/86  Pulse: (!) 56  Temp: 97.9 F (36.6 C)  TempSrc: Oral  SpO2: 98%  Weight: 161 lb (73 kg)  Height: 5\' 5"  (1.651 m)    Assessment & Plan:

## 2018-09-09 ENCOUNTER — Encounter: Payer: Self-pay | Admitting: Internal Medicine

## 2018-09-09 NOTE — Progress Notes (Signed)
Abstracted and sent to scan  

## 2018-09-10 DIAGNOSIS — Z139 Encounter for screening, unspecified: Secondary | ICD-10-CM | POA: Diagnosis not present

## 2018-09-10 DIAGNOSIS — Z1231 Encounter for screening mammogram for malignant neoplasm of breast: Secondary | ICD-10-CM | POA: Diagnosis not present

## 2018-09-21 DIAGNOSIS — N912 Amenorrhea, unspecified: Secondary | ICD-10-CM | POA: Diagnosis not present

## 2018-09-21 DIAGNOSIS — N760 Acute vaginitis: Secondary | ICD-10-CM | POA: Diagnosis not present

## 2018-09-28 DIAGNOSIS — Z1283 Encounter for screening for malignant neoplasm of skin: Secondary | ICD-10-CM | POA: Diagnosis not present

## 2018-09-28 DIAGNOSIS — D225 Melanocytic nevi of trunk: Secondary | ICD-10-CM | POA: Diagnosis not present

## 2018-09-30 ENCOUNTER — Ambulatory Visit: Payer: Self-pay

## 2018-09-30 ENCOUNTER — Telehealth: Payer: Self-pay

## 2018-09-30 ENCOUNTER — Ambulatory Visit (INDEPENDENT_AMBULATORY_CARE_PROVIDER_SITE_OTHER)
Admission: RE | Admit: 2018-09-30 | Discharge: 2018-09-30 | Disposition: A | Discharge status: Home / Self Care | Payer: BC Managed Care – PPO | Source: Ambulatory Visit | Attending: Family Medicine | Admitting: Family Medicine

## 2018-09-30 ENCOUNTER — Ambulatory Visit (INDEPENDENT_AMBULATORY_CARE_PROVIDER_SITE_OTHER): Payer: BC Managed Care – PPO | Admitting: Family Medicine

## 2018-09-30 ENCOUNTER — Other Ambulatory Visit: Payer: Self-pay

## 2018-09-30 ENCOUNTER — Encounter: Payer: Self-pay | Admitting: Family Medicine

## 2018-09-30 VITALS — BP 128/86 | HR 77 | Ht 65.0 in | Wt 166.0 lb

## 2018-09-30 DIAGNOSIS — M25561 Pain in right knee: Secondary | ICD-10-CM

## 2018-09-30 DIAGNOSIS — M6751 Plica syndrome, right knee: Secondary | ICD-10-CM | POA: Diagnosis not present

## 2018-09-30 DIAGNOSIS — M222X1 Patellofemoral disorders, right knee: Secondary | ICD-10-CM

## 2018-09-30 MED ORDER — DICLOFENAC SODIUM 2 % TD SOLN: 2.0000 g | Freq: Two times a day (BID) | TRANSDERMAL | 3 refills | Status: DC

## 2018-09-30 MED ORDER — VITAMIN D (ERGOCALCIFEROL) 1.25 MG (50000 UNIT) PO CAPS: 50000.0000 [IU] | ORAL_CAPSULE | ORAL | 0 refills | Status: DC

## 2018-09-30 NOTE — Telephone Encounter (Signed)
Error

## 2018-09-30 NOTE — Progress Notes (Signed)
[facility] [address] [place], [place] [address] Phone: [phone] Subjective:   Bruce Donath, am serving as a scribe for Dr. Antoine Primas.   I'm seeing this patient by the request  of:  Myrlene Broker, MD   CC: Right knee pain  [id number]  Kimberly Galloway is a 38 y.o. female coming in with complaint of right knee pain. Patient is having pain since February. Pain over medial aspect and patellar tendon. Does Crossfit. Has a hard time with jumping and single leg squats. Is able to perform double leg squats. Pain scale is 0/10 unless she is running or working out. Does not take any medication.      Past Medical History:  Diagnosis Date  . Chlamydia   . HSV (herpes simplex virus) infection   . Medical history non-contributory    Past Surgical History:  Procedure Laterality Date  . Uterine septum removed  2010  . WISDOM TOOTH EXTRACTION     Social History   Socioeconomic History  . Marital status: Single    Spouse name: Not on file  . Number of children: Not on file  . Years of education: Not on file  . Highest education level: Not on file  Occupational History  . Not on file  Social Needs  . Financial resource strain: Not on file  . Food insecurity    Worry: Not on file    Inability: Not on file  . Transportation needs    Medical: Not on file    Non-medical: Not on file  Tobacco Use  . Smoking status: Former Games developer  . Smokeless tobacco: Never Used  Substance and Sexual Activity  . Alcohol use: Yes    Comment: Occas. when not preg.  . Drug use: No  . Sexual activity: Yes    Birth control/protection: None  Lifestyle  . Physical activity    Days per week: Not on file    Minutes per session: Not on file  . Stress: Not on file  Relationships  . Social Musician on phone: Not on file    Gets together: Not on file    Attends religious service: Not on file    Active member of club or organization: Not on  file    Attends meetings of clubs or organizations: Not on file    Relationship status: Not on file  Other Topics Concern  . Not on file  Social History Narrative  . Not on file   No Known Allergies Family History  Problem Relation Age of Onset  . Arthritis Mother   . Alcohol abuse Father   . Hypertension Father   . Cancer Father        thyroid  . Arthritis Maternal Grandmother   . Cancer Maternal Grandmother        breast  . Stroke Maternal Grandmother   . Breast cancer Maternal Grandmother   . Arthritis Maternal Grandfather   . Heart disease Maternal Grandfather   . Arthritis Paternal Grandmother   . Stroke Paternal Grandmother   . Arthritis Paternal Grandfather   . Heart disease Paternal Grandfather   . Hypertension Paternal Grandfather          Current Outpatient Medications (Other):  Marland Kitchen  FLUoxetine (PROZAC) 20 MG capsule, TK 1 C PO D .  Multiple Vitamins-Minerals (MULTIVITAMIN WITH MINERALS) tablet, Take 1 tablet by mouth daily. .  Diclofenac Sodium 2 % SOLN, Place 2 g  onto the skin 2 (two) times daily. .  Vitamin D, Ergocalciferol, (DRISDOL) 1.25 MG (50000 UT) CAPS capsule, Take 1 capsule (50,000 Units total) by mouth every 7 (seven) days.    Past medical history, social, surgical and family history all reviewed in electronic medical record.  No pertanent information unless stated regarding to the chief complaint.   Review of Systems:  No headache, visual changes, nausea, vomiting, diarrhea, constipation, dizziness, abdominal pain, skin rash, fevers, chills, night sweats, weight loss, swollen lymph nodes, body aches, joint swelling, chest pain, shortness of breath, mood changes.  Positive muscle aches  Objective  Blood pressure 128/86, pulse 77, height 5\' 5"  (1.651 m), weight 166 lb (75.3 kg), SpO2 98 %.    General: No apparent distress alert and oriented x3 mood and affect normal, dressed appropriately.  HEENT: Pupils equal, extraocular movements intact   Respiratory: Patient's speak in full sentences and does not appear short of breath  Cardiovascular: No lower extremity edema, non tender, no erythema  Skin: Warm dry intact with no signs of infection or rash on extremities or on axial skeleton.  Abdomen: Soft nontender  Neuro: Cranial nerves II through XII are intact, neurovascularly intact in all extremities with 2+ DTRs and 2+ pulses.  Lymph: No lymphadenopathy of posterior or anterior cervical chain or axillae bilaterally.  Gait normal with good balance and coordination.  MSK:  Non tender with full range of motion and good stability and symmetric strength and tone of shoulders, elbows, wrist, hip, and ankles bilaterally.  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  Positive grind test noted.  Patient does have some pain over the patellofemoral joint.  Good stability of the knee otherwise.  Limited musculoskeletal ultrasound was performed and interpreted by Lyndal Pulley  Limited ultrasound shows the patient does have a medial plica noted.  Good articular cartilage noted of all 3 compartments still noted.  No true meniscal tears noted. Impression: Medial plica otherwise fairly unremarkable   Impression and Recommendations:     This case required medical decision making of moderate complexity. The above documentation has been reviewed and is accurate and complete Lyndal Pulley, DO       Note: This dictation was prepared with Dragon dictation along with smaller phrase technology. Any transcriptional errors that result from this process are unintentional.

## 2018-09-30 NOTE — Patient Instructions (Addendum)
Good to see you.  Ice 20 minutes 2 times daily. Usually after activity and before bed. Exercises 3 times a week.  pennsaid pinkie amount topically 2 times daily as needed.  Once weekly vitamin D for 12 weeks.  Compression sleeve with working out Low impact exercise Xray downstairs See me again in 5-6 weeks

## 2018-10-01 ENCOUNTER — Encounter: Payer: Self-pay | Admitting: Family Medicine

## 2018-10-01 DIAGNOSIS — M222X1 Patellofemoral disorders, right knee: Secondary | ICD-10-CM | POA: Insufficient documentation

## 2018-10-01 DIAGNOSIS — M6751 Plica syndrome, right knee: Secondary | ICD-10-CM | POA: Insufficient documentation

## 2018-10-01 NOTE — Assessment & Plan Note (Signed)
Noted medial superior, on ultrasound, discussed which activities to do which wants to avoid.  Patient will monitor.  Follow-up again in 4 to 8 weeks

## 2018-10-01 NOTE — Assessment & Plan Note (Signed)
Reviewed anatomy using anatomical model and how PFS occurs.  Given rehab exercises handout for VMO, hip abductors, core, entire kinetic chain including proprioception exercises.  Could benefit from PT, regular exercise, upright biking, and a PFS knee brace to assist with tracking abnormalities. RTC in 4 weeks if no improvement we will consider formal physical therapy or potential injections

## 2018-11-11 ENCOUNTER — Ambulatory Visit (INDEPENDENT_AMBULATORY_CARE_PROVIDER_SITE_OTHER): Payer: BC Managed Care – PPO | Admitting: Family Medicine

## 2018-11-11 ENCOUNTER — Other Ambulatory Visit: Payer: Self-pay

## 2018-11-11 ENCOUNTER — Encounter: Payer: Self-pay | Admitting: Family Medicine

## 2018-11-11 DIAGNOSIS — M222X1 Patellofemoral disorders, right knee: Secondary | ICD-10-CM | POA: Diagnosis not present

## 2018-11-11 NOTE — Progress Notes (Signed)
[facility] [place], [place] [address] Phone: [phone] Subjective:   I Kimberly Galloway am serving as a Education administrator for Dr. Hulan Saas.   CC: Right knee pain follow-up  HKV:[id number]   09/30/2018 Reviewed anatomy using anatomical model and how PFS occurs.  Given rehab exercises handout for VMO, hip abductors, core, entire kinetic chain including proprioception exercises.  Could benefit from PT, regular exercise, upright biking, and a PFS knee brace to assist with tracking abnormalities. RTC in 4 weeks if no improvement we will consider formal physical therapy or potential injections  11/11/2018 Kimberly Galloway is a 38 y.o. female coming in with complaint of right knee pain. States she was feeling better but after traveling she feels she is not 100% but she is better than she was.  Patient has been having some difficulty over the course of time.  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  Been working on at home on a regular basis.  Has not noticed any swelling, denies any instability.    Past Medical History:  Diagnosis Date  . Chlamydia   . HSV (herpes simplex virus) infection   . Medical history non-contributory    Past Surgical History:  Procedure Laterality Date  . Uterine septum removed  2010  . WISDOM TOOTH EXTRACTION     Social History   Socioeconomic History  . Marital status: Single    Spouse name: Not on file  . Number of children: Not on file  . Years of education: Not on file  . Highest education level: Not on file  Occupational History  . Not on file  Social Needs  . Financial resource strain: Not on file  . Food insecurity    Worry: Not on file    Inability: Not on file  . Transportation needs    Medical: Not on file    Non-medical: Not on file  Tobacco Use  . Smoking status: Former Research scientist (life sciences)  . Smokeless tobacco: Never Used  Substance and Sexual Activity  .  Alcohol use: Yes    Comment: Occas. when not preg.  . Drug use: No  . Sexual activity: Yes    Birth control/protection: None  Lifestyle  . Physical activity    Days per week: Not on file    Minutes per session: Not on file  . Stress: Not on file  Relationships  . Social Herbalist on phone: Not on file    Gets together: Not on file    Attends religious service: Not on file    Active member of club or organization: Not on file    Attends meetings of clubs or organizations: Not on file    Relationship status: Not on file  Other Topics Concern  . Not on file  Social History Narrative  . Not on file   No Known Allergies Family History  Problem Relation Age of Onset  . Arthritis Mother   . Alcohol abuse Father   . Hypertension Father   . Cancer Father        thyroid  . Arthritis Maternal Grandmother   . Cancer Maternal Grandmother        breast  . Stroke Maternal Grandmother   . Breast cancer Maternal Grandmother   . Arthritis Maternal Grandfather   . Heart disease Maternal Grandfather   . Arthritis Paternal Grandmother   . Stroke Paternal Grandmother   .  Arthritis Paternal Grandfather   . Heart disease Paternal Grandfather   . Hypertension Paternal Grandfather          Current Outpatient Medications (Other):  Marland Kitchen  Diclofenac Sodium 2 % SOLN, Place 2 g onto the skin 2 (two) times daily. Marland Kitchen  FLUoxetine (PROZAC) 20 MG capsule, TK 1 C PO D .  Multiple Vitamins-Minerals (MULTIVITAMIN WITH MINERALS) tablet, Take 1 tablet by mouth daily. .  Vitamin D, Ergocalciferol, (DRISDOL) 1.25 MG (50000 UT) CAPS capsule, Take 1 capsule (50,000 Units total) by mouth every 7 (seven) days.    Past medical history, social, surgical and family history all reviewed in electronic medical record.  No pertanent information unless stated regarding to the chief complaint.   Review of Systems:  No headache, visual changes, nausea, vomiting, diarrhea, constipation, dizziness,  abdominal pain, skin rash, fevers, chills, night swe chest pain, shortness of breath, mood changes.  Positive muscle aches  Objective  Blood pressure 102/60, pulse 79, height 5\' 5"  (1.651 m), weight 167 lb (75.8 kg), SpO2 97 %.    General: No apparent distress alert and oriented x3 mood and affect normal, dressed appropriately.  HEENT: Pupils equal, extraocular movements intact  Respiratory: Patient's speak in full sentences and does not appear short of breath  Cardiovascular: No lower extremity edema, non tender, no erythema  Skin: Warm dry intact with no signs of infection or rash on extremities or on axial skeleton.  Abdomen: Soft nontender  Neuro: Cranial nerves II through XII are intact, neurovascularly intact in all extremities with 2+ DTRs and 2+ pulses.  Lymph: No lymphadenopathy of posterior or anterior cervical chain or axillae bilaterally.  Gait normal with good balance and coordination.  MSK:  Non tender with full range of motion and good stability and symmetric strength and tone of shoulders, elbows, wrist, hip, and ankles bilaterally.  Right knee exam shows the patient does have some positive patellar grind test and lateral tracking.  With no instability of the knee noted nontender on exam today than noted.    Impression and Recommendations:      The above documentation has been reviewed and is accurate and complete , DO       Note: This dictation was prepared with Dragon dictation along with smaller phrase technology. Any transcriptional errors that result from this process are unintentional.

## 2018-11-11 NOTE — Patient Instructions (Signed)
Good to see you Doing great Exercise regularly  Hoka arahi  See me again in 2-3 months if not perfect

## 2018-11-11 NOTE — Assessment & Plan Note (Signed)
Patient does have more over the patellofemoral as well as the medial plica.  Patient still has been doing well and had 1 mild exacerbation.  Discussed with patient about icing regimen and home exercise, wants to continue with conservative therapy and hold on any type of injection at this moment.  Patient will follow up with me again in 2 to 3 months

## 2019-04-23 ENCOUNTER — Ambulatory Visit: Payer: Self-pay

## 2019-04-23 NOTE — Progress Notes (Signed)
   Covid-19 Vaccination Clinic  Name:  Kimberly Galloway    MRN: [id number] DOB: [birth year]  04/23/2019  [name] was observed post Covid-19 immunization for 15 minutes without incident. She was provided with Vaccine Information Sheet and instruction to access the V-Safe system.   [name] was instructed to call 911 with any severe reactions post vaccine: Marland Kitchen Difficulty breathing  . Swelling of face and throat  . A fast heartbeat  . A bad rash all over body  . Dizziness and weakness   Immunizations Administered    Name Date Dose VIS Date Route   Pfizer COVID-19 Vaccine 04/23/2019 11:57 AM 0.3 mL 12/18/2018 Intramuscular   Manufacturer: [employer], [employer]   Lot: [id number]   NDC: [address]-5

## 2019-04-23 NOTE — Progress Notes (Signed)
Documented in error on wrong patient. Documentation removed.

## 2019-05-14 ENCOUNTER — Encounter: Payer: Self-pay | Admitting: Internal Medicine

## 2019-05-14 ENCOUNTER — Other Ambulatory Visit: Payer: Self-pay

## 2019-05-14 ENCOUNTER — Ambulatory Visit (INDEPENDENT_AMBULATORY_CARE_PROVIDER_SITE_OTHER): Payer: 59 | Admitting: Internal Medicine

## 2019-05-14 VITALS — BP 120/82 | HR 66 | Temp 98.0°F | Ht 65.0 in | Wt 168.0 lb

## 2019-05-14 DIAGNOSIS — R1011 Right upper quadrant pain: Secondary | ICD-10-CM | POA: Insufficient documentation

## 2019-05-14 LAB — CBC
HCT: 37.5 % (ref 36.0–46.0)
Hemoglobin: 12.5 g/dL (ref 12.0–15.0)
MCHC: 33.5 g/dL (ref 30.0–36.0)
MCV: 93.5 fl (ref 78.0–100.0)
Platelets: 263 10*3/uL (ref 150.0–400.0)
RBC: 4.01 Mil/uL (ref 3.87–5.11)
RDW: 13 % (ref 11.5–15.5)
WBC: 13.6 10*3/uL — ABNORMAL HIGH (ref 4.0–10.5)

## 2019-05-14 LAB — COMPREHENSIVE METABOLIC PANEL
ALT: 16 U/L (ref 0–35)
AST: 18 U/L (ref 0–37)
Albumin: 4.3 g/dL (ref 3.5–5.2)
Alkaline Phosphatase: 40 U/L (ref 39–117)
BUN: 14 mg/dL (ref 6–23)
CO2: 27 mEq/L (ref 19–32)
Calcium: 9.1 mg/dL (ref 8.4–10.5)
Chloride: 104 mEq/L (ref 96–112)
Creatinine, Ser: 0.84 mg/dL (ref 0.40–1.20)
GFR: 74.64 mL/min (ref >60.00)
Glucose, Bld: 93 mg/dL (ref 70–99)
Potassium: 3.7 mEq/L (ref 3.5–5.1)
Sodium: 137 mEq/L (ref 135–145)
Total Bilirubin: 0.5 mg/dL (ref 0.2–1.2)
Total Protein: 6.9 g/dL (ref 6.0–8.3)

## 2019-05-14 LAB — LIPASE: Lipase: 26 U/L (ref 11.0–59.0)

## 2019-05-14 NOTE — Progress Notes (Signed)
   Subjective:   Patient ID: Kimberly Galloway, female    DOB: 02/02/1980, 39 y.o.   MRN: [id number]  HPI The patient is a 39 YO female coming in for concerns about right sided abdomen pain. Radiates into the right arm. Started about 1 week ago overall. Has had rare episodes of this over the years which lasted about 10 minutes and not predictable or triggers she could identify. This episode on 05/08/19 lasted several hours and she did not feel normal even later that day. Denies food triggers or association with eating. Pain is 10/10 when present. Not hurting today. No recent fevers or chills. No nausea or vomiting. No diarrhea or constipation.   Review of Systems  Constitutional: Negative.   HENT: Negative.   Eyes: Negative.   Respiratory: Negative for cough, chest tightness and shortness of breath.   Cardiovascular: Negative for chest pain, palpitations and leg swelling.  Gastrointestinal: Positive for abdominal pain. Negative for abdominal distention, constipation, diarrhea, nausea and vomiting.  Musculoskeletal: Negative.   Skin: Negative.   Neurological: Negative.   Psychiatric/Behavioral: Negative.     Objective:  Physical Exam Constitutional:      Appearance: She is well-developed.  HENT:     Head: Normocephalic and atraumatic.  Cardiovascular:     Rate and Rhythm: Normal rate and regular rhythm.  Pulmonary:     Effort: Pulmonary effort is normal. No respiratory distress.     Breath sounds: Normal breath sounds. No wheezing or rales.  Abdominal:     General: Bowel sounds are normal. There is no distension.     Palpations: Abdomen is soft.     Tenderness: There is no abdominal tenderness. There is no rebound.  Musculoskeletal:     Cervical back: Normal range of motion.  Skin:    General: Skin is warm and dry.  Neurological:     Mental Status: She is alert and oriented to person, place, and time.     Coordination: Coordination normal.     Vitals:   05/14/19 1512  BP:  120/82  Pulse: 66  Temp: 98 F (36.7 C)  SpO2: 99%  Weight: 168 lb (76.2 kg)  Height: 5\' 5"  (1.651 m)    This visit occurred during the SARS-CoV-2 public health emergency.  Safety protocols were in place, including screening questions prior to the visit, additional usage of staff PPE, and extensive cleaning of exam room while observing appropriate contact time as indicated for disinfecting solutions.   Assessment & Plan:

## 2019-05-14 NOTE — Assessment & Plan Note (Signed)
Needs CBC, CMP, lipase to evaluate etiology as well as RUQ [place] to evaluate gallbladder which is likely etiology.

## 2019-05-14 NOTE — Patient Instructions (Signed)
We will check the labs today and get the ultrasound of the gallbladder.

## 2019-05-18 ENCOUNTER — Ambulatory Visit: Payer: Self-pay

## 2019-05-21 ENCOUNTER — Ambulatory Visit
Admission: RE | Admit: 2019-05-21 | Discharge: 2019-05-21 | Disposition: A | Discharge status: Home / Self Care | Payer: 59 | Source: Ambulatory Visit | Attending: Internal Medicine | Admitting: Internal Medicine

## 2019-05-21 DIAGNOSIS — R1011 Right upper quadrant pain: Secondary | ICD-10-CM

## 2019-05-22 ENCOUNTER — Ambulatory Visit: Payer: 59 | Attending: Internal Medicine

## 2019-05-22 DIAGNOSIS — Z23 Encounter for immunization: Secondary | ICD-10-CM

## 2019-05-22 NOTE — Progress Notes (Signed)
   Covid-19 Vaccination Clinic  Name:  [name]    MRN: [id number] DOB: 15-May-1980  05/22/2019  [name] was observed post Covid-19 immunization for 15 minutes without incident. She was provided with Vaccine Information Sheet and instruction to access the V-Safe system.   [name] was instructed to call 911 with any severe reactions post vaccine: Marland Kitchen Difficulty breathing  . Swelling of face and throat  . A fast heartbeat  . A bad rash all over body  . Dizziness and weakness   Immunizations Administered    Name Date Dose VIS Date Route   Pfizer COVID-19 Vaccine 05/22/2019  9:16 AM 0.3 mL 03/03/2018 Intramuscular   Manufacturer: [employer], [employer]   Lot: [id number]   NDC: [address]-4

## 2019-10-25 IMAGING — US ULTRASOUND LEFT BREAST LIMITED
1 series · 10 of 10 positions shown · non-contrast
Comparison: Previous exam(s).

CLINICAL DATA: Patient reports a palpable lump along the lateral
aspect of breast. Patient's referring clinician reports a lump in
upper outer left breast in addition to that detected by the patient.

EXAM:
DIGITAL DIAGNOSTIC LEFT MAMMOGRAM WITH CAD AND TOMO
ULTRASOUND LEFT BREAST

[Series 1: ultrasound left breast limited · 0.05mm/px · 10 of 10 slices shown]
[im 1/10]
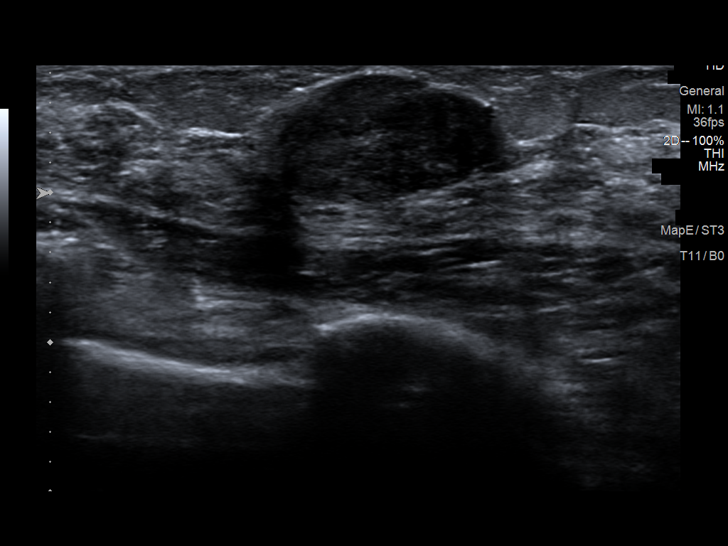
[im 2/10]
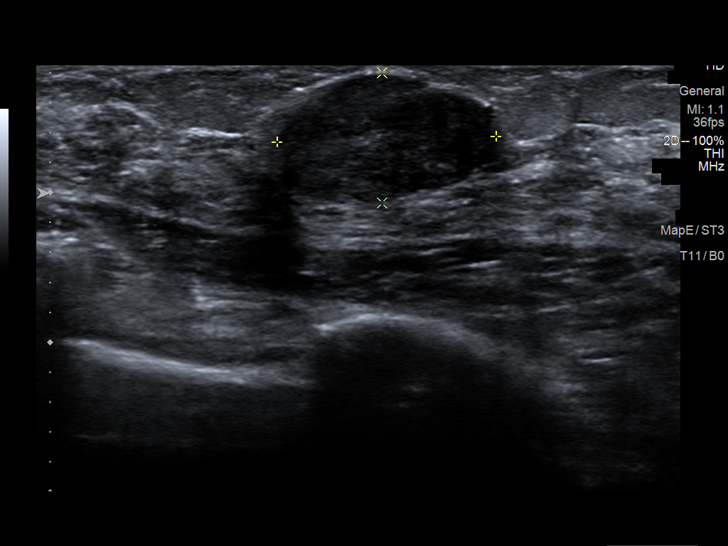
[im 3/10]
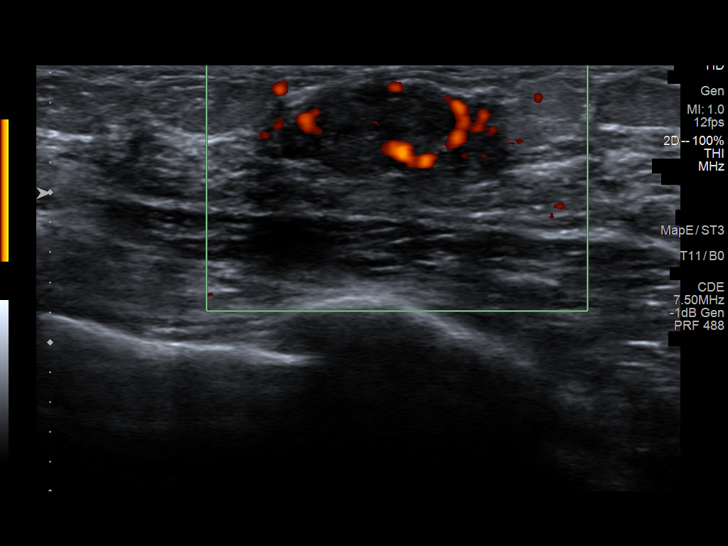
[im 4/10]
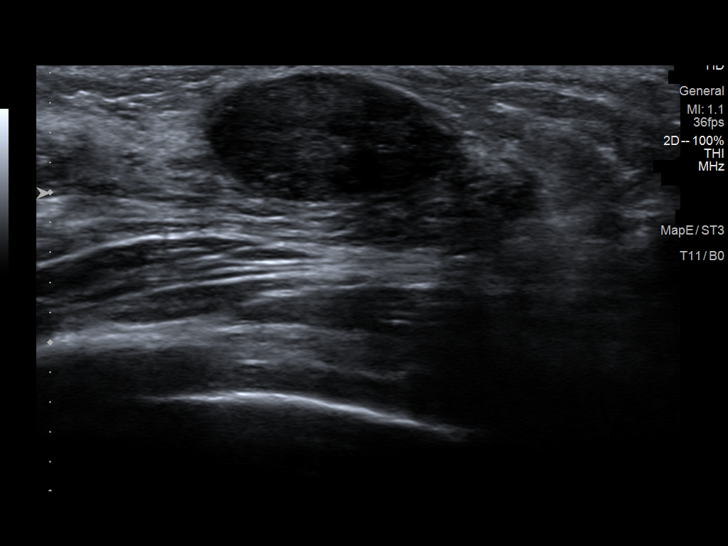
[im 5/10]
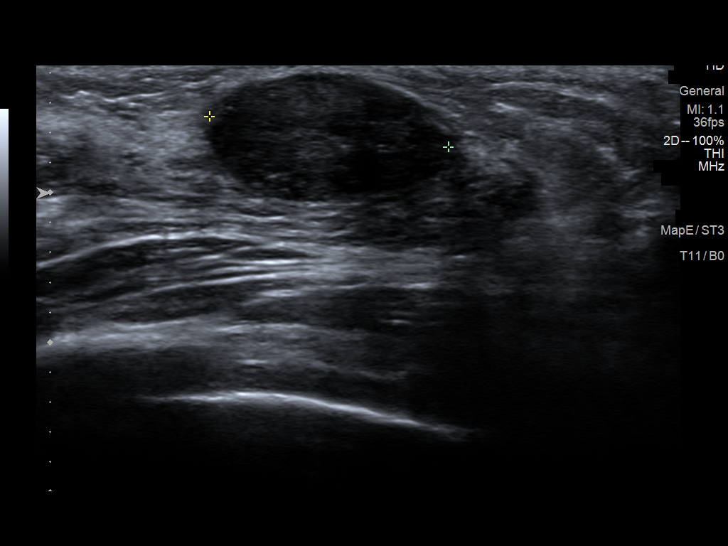
[im 6/10]
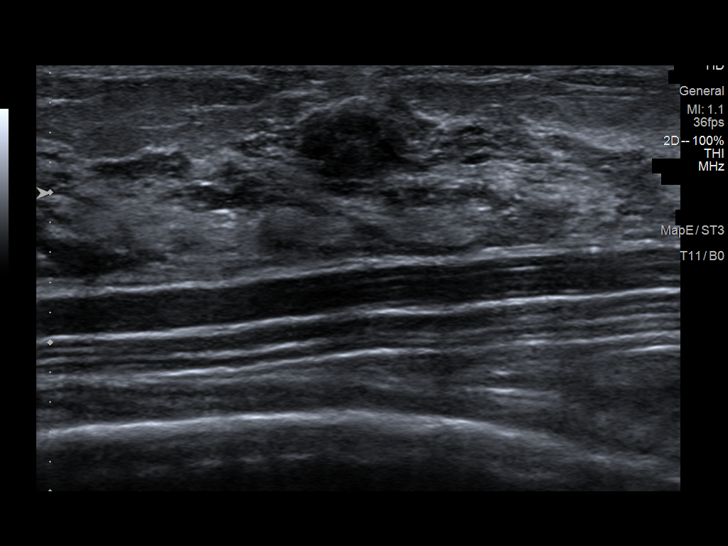
[im 7/10]
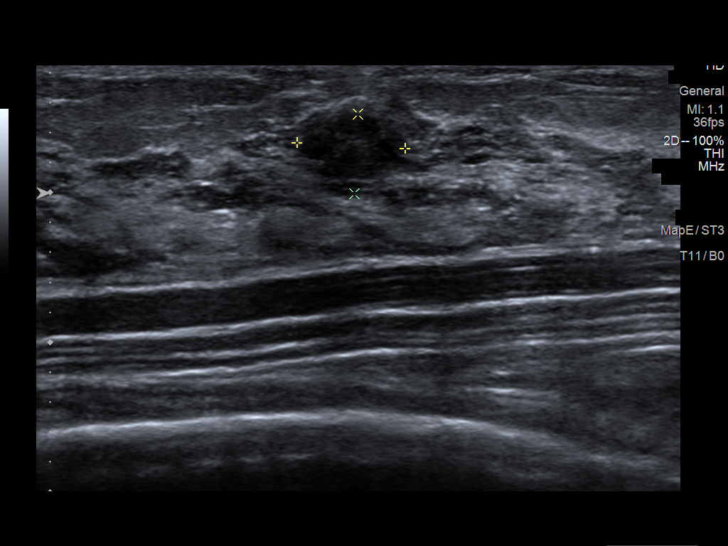
[im 8/10]
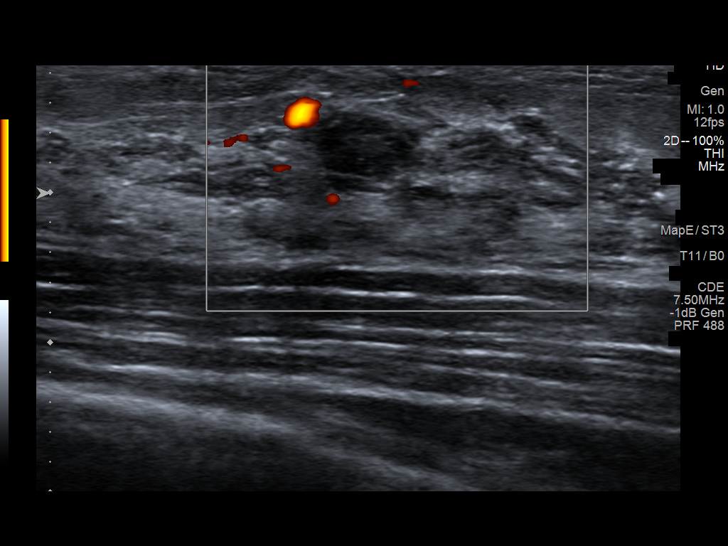
[im 9/10]
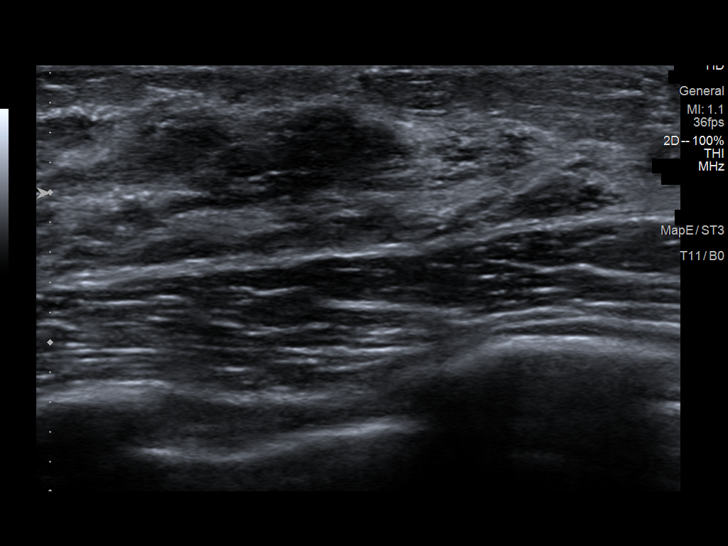
[im 10/10]
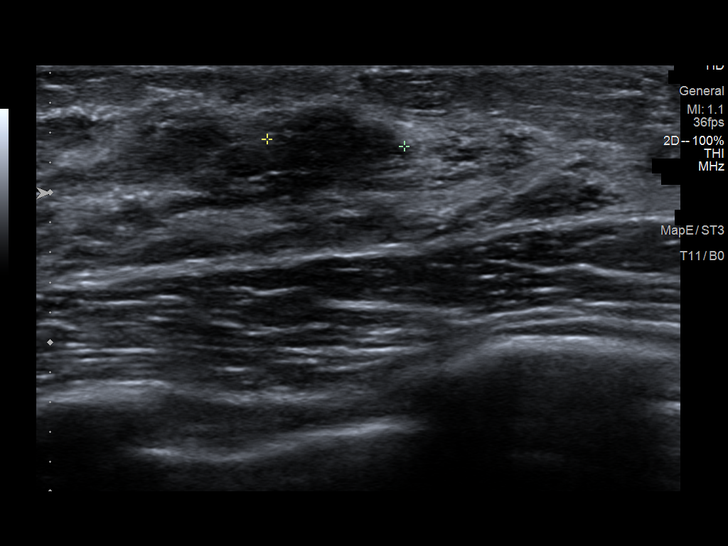

[10 of 10 positions shown; findings below may reference images not displayed]

ACR Breast Density Category d: The breast tissue is extremely dense,
which lowers the sensitivity of mammography.
FINDINGS: There are no discrete masses, areas of architectural distortion,
areas of significant asymmetry or suspicious calcifications.

Mammographic images were processed with CAD.

On physical exam, there is a small smooth mobile mass in the lateral
left breast corresponding to the mass detected the patient. No other
discrete palpable masses in the outer or lateral left breast.

Targeted ultrasound is performed, showing a hypoechoic oval,
circumscribed parallel mass in the left breast at 3 o'clock, 4 cm
from the nipple, demonstrating internal blood flow on color Doppler
analysis, measuring 15 x 9 x 16 mm, corresponding to the palpable
abnormality. In the 11 o'clock position, 2 cm the nipple, there is a
focal hypoechoic area, possibly a small mass, overall oval in shape
with ill-defined margins, measuring approximately 9 x 5 x 7 mm.
There are few small cysts in the upper outer left breast. No other
solid masses.
IMPRESSION: 1. Probably benign mass in the left breast at 3 o'clock measuring 16
mm in greatest dimension, corresponding to the lump palpated by the
patient and palpated on physical exam. The mass is consistent with a
fibroadenoma.
2. Focal hypoechoic area in the left breast at 11 o'clock, which may
reflect an additional benign mass or an area of focal fibroglandular
tissue.

RECOMMENDATION:
Ultrasound the left breast in 6 months to reassess the 3 o'clock
position mass and questionable 11 o'clock position mass.

I have discussed the findings and recommendations with the patient.
Results were also provided in writing at the conclusion of the
visit. If applicable, a reminder letter will be sent to the patient
regarding the next appointment.

BI-RADS CATEGORY  3: Probably benign.

## 2021-02-19 ENCOUNTER — Encounter: Payer: 59 | Admitting: Internal Medicine

## 2021-03-14 ENCOUNTER — Ambulatory Visit (INDEPENDENT_AMBULATORY_CARE_PROVIDER_SITE_OTHER): Payer: 59 | Admitting: Internal Medicine

## 2021-03-14 ENCOUNTER — Other Ambulatory Visit: Payer: Self-pay

## 2021-03-14 ENCOUNTER — Encounter: Payer: Self-pay | Admitting: Internal Medicine

## 2021-03-14 VITALS — BP 122/80 | HR 55 | Resp 18 | Ht 65.0 in | Wt 166.2 lb

## 2021-03-14 DIAGNOSIS — Z0000 Encounter for general adult medical examination without abnormal findings: Secondary | ICD-10-CM

## 2021-03-14 LAB — CBC
HCT: 38.1 % (ref 36.0–46.0)
Hemoglobin: 12.6 g/dL (ref 12.0–15.0)
MCHC: 33 g/dL (ref 30.0–36.0)
MCV: 94.5 fl (ref 78.0–100.0)
Platelets: 284 10*3/uL (ref 150.0–400.0)
RBC: 4.03 Mil/uL (ref 3.87–5.11)
RDW: 13.3 % (ref 11.5–15.5)
WBC: 5.3 10*3/uL (ref 4.0–10.5)

## 2021-03-14 LAB — COMPREHENSIVE METABOLIC PANEL
ALT: 15 U/L (ref 0–35)
AST: 20 U/L (ref 0–37)
Albumin: 4.4 g/dL (ref 3.5–5.2)
Alkaline Phosphatase: 39 U/L (ref 39–117)
BUN: 12 mg/dL (ref 6–23)
CO2: 31 mEq/L (ref 19–32)
Calcium: 9.7 mg/dL (ref 8.4–10.5)
Chloride: 104 mEq/L (ref 96–112)
Creatinine, Ser: 0.76 mg/dL (ref 0.40–1.20)
GFR: 96.22 mL/min (ref >60.00)
Glucose, Bld: 88 mg/dL (ref 70–99)
Potassium: 4.5 mEq/L (ref 3.5–5.1)
Sodium: 139 mEq/L (ref 135–145)
Total Bilirubin: 0.9 mg/dL (ref 0.2–1.2)
Total Protein: 6.8 g/dL (ref 6.0–8.3)

## 2021-03-14 LAB — LIPID PANEL
Cholesterol: 248 mg/dL — ABNORMAL HIGH (ref 0–200)
HDL: 85 mg/dL (ref >39.00)
LDL Cholesterol: 146 mg/dL — ABNORMAL HIGH (ref 0–99)
NonHDL: 162.71
Total CHOL/HDL Ratio: 3
Triglycerides: 84 mg/dL (ref 0.0–149.0)
VLDL: 16.8 mg/dL (ref 0.0–40.0)

## 2021-03-14 NOTE — Progress Notes (Signed)
? ?  Subjective:  ? ?Patient ID: Kimberly Galloway, female    DOB: February 04, 1980, 41 y.o.   MRN: [id number] ? ?HPI ?The patient is here for physical. ? ?PMH, [facility], social history reviewed and updated ? ?Review of Systems  ?Constitutional: Negative.   ?HENT: Negative.    ?Eyes: Negative.   ?Respiratory:  Negative for cough, chest tightness and shortness of breath.   ?Cardiovascular:  Negative for chest pain, palpitations and leg swelling.  ?Gastrointestinal:  Negative for abdominal distention, abdominal pain, constipation, diarrhea, nausea and vomiting.  ?Musculoskeletal: Negative.   ?Skin: Negative.   ?Neurological: Negative.   ?Psychiatric/Behavioral: Negative.    ? ?Objective:  ?Physical Exam ?Constitutional:   ?   Appearance: She is well-developed.  ?HENT:  ?   Head: Normocephalic and atraumatic.  ?Cardiovascular:  ?   Rate and Rhythm: Normal rate and regular rhythm.  ?Pulmonary:  ?   Effort: Pulmonary effort is normal. No respiratory distress.  ?   Breath sounds: Normal breath sounds. No wheezing or rales.  ?Abdominal:  ?   General: Bowel sounds are normal. There is no distension.  ?   Palpations: Abdomen is soft.  ?   Tenderness: There is no abdominal tenderness. There is no rebound.  ?Musculoskeletal:  ?   Cervical back: Normal range of motion.  ?Skin: ?   General: Skin is warm and dry.  ?Neurological:  ?   Mental Status: She is alert and oriented to person, place, and time.  ?   Coordination: Coordination normal.  ? ? ?Vitals:  ? 03/14/21 0936  ?BP: 122/80  ?Pulse: (!) 55  ?Resp: 18  ?SpO2: 98%  ?Weight: 166 lb 3.2 oz (75.4 kg)  ?Height: 5\' 5"  (1.651 m)  ? ? ?This visit occurred during the SARS-CoV-2 public health emergency.  Safety protocols were in place, including screening questions prior to the visit, additional usage of staff PPE, and extensive cleaning of exam room while observing appropriate contact time as indicated for disinfecting solutions.  ? ?Assessment & Plan:  ? ?

## 2021-03-16 NOTE — Assessment & Plan Note (Signed)
Flu shot declines. Covid-19 booster counseled. Tetanus up to date. Mammogram up to date with gyn, pap smear up to date with gyn. Counseled about sun safety and mole surveillance. Counseled about the dangers of distracted driving. Given 10 year screening recommendations.  ? ?

## 2022-01-22 ENCOUNTER — Encounter: Payer: Self-pay | Admitting: Internal Medicine

## 2022-01-22 ENCOUNTER — Ambulatory Visit (INDEPENDENT_AMBULATORY_CARE_PROVIDER_SITE_OTHER): Payer: 59 | Admitting: Internal Medicine

## 2022-01-22 VITALS — BP 120/100 | HR 55 | Temp 98.5°F | Ht 65.0 in | Wt 170.0 lb

## 2022-01-22 DIAGNOSIS — R1030 Lower abdominal pain, unspecified: Secondary | ICD-10-CM | POA: Insufficient documentation

## 2022-01-22 NOTE — Progress Notes (Signed)
   Subjective:   Patient ID: Kimberly Galloway, female    DOB: February 25, 1980, 42 y.o.   MRN: [id number]  Abdominal Pain Pertinent negatives include no constipation, diarrhea, nausea or vomiting.   The patient is a 43 YO female coming in for lower abdomen pain. Started Friday with RLQ pain and now general and only with stretching movements. Eating/drinking well. No constipation/d/v/n.   Review of Systems  Constitutional: Negative.   HENT: Negative.    Eyes: Negative.   Respiratory:  Negative for cough, chest tightness and shortness of breath.   Cardiovascular:  Negative for chest pain, palpitations and leg swelling.  Gastrointestinal:  Positive for abdominal pain. Negative for abdominal distention, constipation, diarrhea, nausea and vomiting.  Musculoskeletal: Negative.   Skin: Negative.   Neurological: Negative.   Psychiatric/Behavioral: Negative.      Objective:  Physical Exam Constitutional:      Appearance: She is well-developed.  HENT:     Head: Normocephalic and atraumatic.  Cardiovascular:     Rate and Rhythm: Normal rate and regular rhythm.  Pulmonary:     Effort: Pulmonary effort is normal. No respiratory distress.     Breath sounds: Normal breath sounds. No wheezing or rales.  Abdominal:     General: Bowel sounds are normal. There is no distension.     Palpations: Abdomen is soft.     Tenderness: There is abdominal tenderness in the left lower quadrant. There is no guarding or rebound.  Musculoskeletal:     Cervical back: Normal range of motion.  Skin:    General: Skin is warm and dry.  Neurological:     Mental Status: She is alert and oriented to person, place, and time.     Coordination: Coordination normal.     Vitals:   01/22/22 1408 01/22/22 1412  BP: (!) 120/100 (!) 120/100  Pulse: (!) 55   Temp: 98.5 F (36.9 C)   TempSrc: Oral   SpO2: 98%   Weight: 170 lb (77.1 kg)   Height: 5\' 5"  (1.651 m)     Assessment & Plan:

## 2022-01-22 NOTE — Assessment & Plan Note (Signed)
Suspect muscle given pain improving and now only tender with direct palpation or stretching. Advised tylenol for pain and heat and if not improving in 2-3 days let us know. We will then order CT abdomen/pelvis to assess. Does not sound consistent with appendicitis or diverticulitis given no bowel symptoms and exam findings.
